# Patient Record
Sex: Male | Born: 1945 | Race: White | Hispanic: No | Marital: Married | Smoking: Current every day smoker
Health system: Southern US, Community
[De-identification: ages and names within clinical notes are randomized; demographics above are authoritative.]

## PROBLEM LIST (undated history)

## (undated) DIAGNOSIS — I10 Essential (primary) hypertension: Secondary | ICD-10-CM

## (undated) DIAGNOSIS — N200 Calculus of kidney: Secondary | ICD-10-CM

---

## 1998-06-28 ENCOUNTER — Emergency Department (HOSPITAL_COMMUNITY): Admission: EM | Admit: 1998-06-28 | Discharge: 1998-06-28 | Payer: Self-pay | Admitting: Emergency Medicine

## 2005-04-12 ENCOUNTER — Emergency Department (HOSPITAL_COMMUNITY): Admission: EM | Admit: 2005-04-12 | Discharge: 2005-04-12 | Payer: Self-pay | Admitting: Emergency Medicine

## 2005-04-24 ENCOUNTER — Emergency Department (HOSPITAL_COMMUNITY): Admission: EM | Admit: 2005-04-24 | Discharge: 2005-04-24 | Payer: Self-pay | Admitting: Emergency Medicine

## 2005-05-24 ENCOUNTER — Emergency Department (HOSPITAL_COMMUNITY): Admission: EM | Admit: 2005-05-24 | Discharge: 2005-05-24 | Payer: Self-pay | Admitting: Emergency Medicine

## 2015-04-15 ENCOUNTER — Encounter (HOSPITAL_COMMUNITY): Payer: Self-pay | Admitting: Physical Medicine and Rehabilitation

## 2015-04-15 ENCOUNTER — Emergency Department (HOSPITAL_COMMUNITY)
Admission: EM | Admit: 2015-04-15 | Discharge: 2015-04-15 | Disposition: A | Discharge status: Home / Self Care | Payer: Medicare Other | Attending: Emergency Medicine | Admitting: Emergency Medicine

## 2015-04-15 DIAGNOSIS — I10 Essential (primary) hypertension: Secondary | ICD-10-CM | POA: Insufficient documentation

## 2015-04-15 DIAGNOSIS — Z792 Long term (current) use of antibiotics: Secondary | ICD-10-CM | POA: Insufficient documentation

## 2015-04-15 DIAGNOSIS — L02212 Cutaneous abscess of back [any part, except buttock]: Secondary | ICD-10-CM | POA: Insufficient documentation

## 2015-04-15 DIAGNOSIS — Z79899 Other long term (current) drug therapy: Secondary | ICD-10-CM | POA: Insufficient documentation

## 2015-04-15 HISTORY — DX: Essential (primary) hypertension: I10

## 2015-04-15 MED ORDER — SULFAMETHOXAZOLE-TRIMETHOPRIM 800-160 MG PO TABS: 1.0000 | ORAL_TABLET | Freq: Two times a day (BID) | ORAL | Status: AC

## 2015-04-15 MED ORDER — LIDOCAINE-EPINEPHRINE 1 %-1:100000 IJ SOLN
10.0000 mL | Freq: Once | INTRAMUSCULAR | Status: AC
  Administered 2015-04-15: 1 mL
  Filled 2015-04-15: qty 1

## 2015-04-15 MED ORDER — HYDROCODONE-ACETAMINOPHEN 5-325 MG PO TABS: 1.0000 | ORAL_TABLET | ORAL | Status: DC | PRN

## 2015-04-15 NOTE — Discharge Instructions (Signed)
Abscess  An abscess is an infected area that contains a collection of pus and debris.It can occur in almost any part of the body. An abscess is also known as a furuncle or boil.  CAUSES   An abscess occurs when tissue gets infected. This can occur from blockage of oil or sweat glands, infection of hair follicles, or a minor injury to the skin. As the body tries to fight the infection, pus collects in the area and creates pressure under the skin. This pressure causes pain. People with weakened immune systems have difficulty fighting infections and get certain abscesses more often.   SYMPTOMS  Usually an abscess develops on the skin and becomes a painful mass that is red, warm, and tender. If the abscess forms under the skin, you may feel a moveable soft area under the skin. Some abscesses break open (rupture) on their own, but most will continue to get worse without care. The infection can spread deeper into the body and eventually into the bloodstream, causing you to feel ill.   DIAGNOSIS   Your caregiver will take your medical history and perform a physical exam. A sample of fluid may also be taken from the abscess to determine what is causing your infection.  TREATMENT   Your caregiver may prescribe antibiotic medicines to fight the infection. However, taking antibiotics alone usually does not cure an abscess. Your caregiver may need to make a small cut (incision) in the abscess to drain the pus. In some cases, gauze is packed into the abscess to reduce pain and to continue draining the area.  HOME CARE INSTRUCTIONS    Only take over-the-counter or prescription medicines for pain, discomfort, or fever as directed by your caregiver.   If you were prescribed antibiotics, take them as directed. Finish them even if you start to feel better.   If gauze is used, follow your caregiver's directions for changing the gauze.   To avoid spreading the infection:   Keep your draining abscess covered with a  bandage.   Wash your hands well.   Do not share personal care items, towels, or whirlpools with others.   Avoid skin contact with others.   Keep your skin and clothes clean around the abscess.   Keep all follow-up appointments as directed by your caregiver.  SEEK MEDICAL CARE IF:    You have increased pain, swelling, redness, fluid drainage, or bleeding.   You have muscle aches, chills, or a general ill feeling.   You have a fever.  MAKE SURE YOU:    Understand these instructions.   Will watch your condition.   Will get help right away if you are not doing well or get worse.  Document Released: 08/18/2005 Document Revised: 05/09/2012 Document Reviewed: 01/21/2012  ExitCare Patient Information 2015 [employer]. This information is not intended to replace advice given to you by your health care provider. Make sure you discuss any questions you have with your health care provider.  Incision and Drainage  Incision and drainage is a procedure in which a sac-like structure (cystic structure) is opened and drained. The area to be drained usually contains material such as pus, fluid, or blood.   LET YOUR CAREGIVER KNOW ABOUT:    Allergies to medicine.   Medicines taken, including vitamins, herbs, eyedrops, over-the-counter medicines, and creams.   Use of steroids (by mouth or creams).   Previous problems with anesthetics or numbing medicines.   History of bleeding problems or blood clots.     Previous surgery.   Other health problems, including diabetes and kidney problems.   Possibility of pregnancy, if this applies.  RISKS AND COMPLICATIONS   Pain.   Bleeding.   Scarring.   Infection.  BEFORE THE PROCEDURE   You may need to have an ultrasound or other imaging tests to see how large or deep your cystic structure is. Blood tests may also be used to determine if you have an infection or how severe the infection is. You may need to have a tetanus shot.  PROCEDURE   The affected area is cleaned with a  cleaning fluid. The cyst area will then be numbed with a medicine (local anesthetic). A small incision will be made in the cystic structure. A syringe or catheter may be used to drain the contents of the cystic structure, or the contents may be squeezed out. The area will then be flushed with a cleansing solution. After cleansing the area, it is often gently packed with a gauze or another wound dressing. Once it is packed, it will be covered with gauze and tape or some other type of wound dressing.  AFTER THE PROCEDURE    Often, you will be allowed to go home right after the procedure.   You may be given antibiotic medicine to prevent or heal an infection.   If the area was packed with gauze or some other wound dressing, you will likely need to come back in 1 to 2 days to get it removed.   The area should heal in about 14 days.  Document Released: 05/04/2001 Document Revised: 05/09/2012 Document Reviewed: 01/03/2012  ExitCare Patient Information 2015 [employer]. This information is not intended to replace advice given to you by your health care provider. Make sure you discuss any questions you have with your health care provider.

## 2015-04-15 NOTE — ED Provider Notes (Signed)
CSN: [id number]     Arrival date & time 04/15/15  1720 History  This chart was scribed for Marlon Peliffany Anila Bojarski, PA, working with Mancel BaleElliott Wentz, MD by Lyndel SafeKaitlyn Shelton and Gwenyth Oberatherine Macek, ED Scribes. This paitent was seen in room TR07C/TR07C and the patient's care was started at 7:07 PM.   Chief Complaint  Patient presents with  . Abscess    Patient is a 69 y.o. male presenting with abscess. The history is provided by the patient. No language interpreter was used.  Abscess Associated symptoms: no fever   HPI Comments: Douglas Chavez is a 69 y.o. male who presents to the Emergency Department complaining of a gradually worsening abscess, with associated 5/10 pain, in his mid-right upper back region that has been ongoing for several weeks.  He states purulent, yellow drainage from the area as an associated symptom. Pt reports the area started as a cyst that he has had for years, but became worse after he hit it on something. He notes a similar, nonpurulent cyst adjacent to the wound. Pt reports that he visited Urgent Care 5 days ago where they prescribed him Keflex, but did not drain the abscess. He denies any change in swelling after taking the Keflex. Pt denies a PMhx of DM. He also denies fever as an associated symptom.    NO nausea, vomiting, diarrhea, polyuria, dysuria, abdominal pain, CP, headache, SOB, weakness, confusion, fevers, chills   Past Medical History  Diagnosis Date  . Hypertension    History reviewed. No pertinent past surgical history. No family history on file. History  Substance Use Topics  . Smoking status: Never Smoker   . Smokeless tobacco: Not on file  . Alcohol Use: No    Review of Systems  Constitutional: Negative for fever.  Skin: Positive for wound.  All other systems reviewed and are negative.  Allergies  Review of patient's allergies indicates no known allergies.  Home Medications   Prior to Admission medications   Medication Sig Start Date End Date  Taking? Authorizing Provider  cephALEXin (KEFLEX) 500 MG capsule Take 500 mg by mouth 2 (two) times daily.   Yes Historical Provider, MD  ibuprofen (ADVIL,MOTRIN) 200 MG tablet Take 400 mg by mouth every 6 (six) hours as needed for mild pain.   Yes Historical Provider, MD  lisinopril (PRINIVIL,ZESTRIL) 20 MG tablet Take 20 mg by mouth daily.   Yes Historical Provider, MD  HYDROcodone-acetaminophen (NORCO/VICODIN) 5-325 MG per tablet Take 1-2 tablets by mouth every 4 (four) hours as needed. 04/15/15   Latrisha Coiro Neva SeatGreene, PA-C  sulfamethoxazole-trimethoprim (BACTRIM DS,SEPTRA DS) 800-160 MG per tablet Take 1 tablet by mouth 2 (two) times daily. 04/15/15 04/22/15  Garold Sheeler Neva SeatGreene, PA-C   BP 143/94 mmHg  Pulse 82  Temp(Src) 97.7 F (36.5 C) (Oral)  Resp 18  SpO2 97%  Physical Exam  Constitutional: He appears well-developed and well-nourished.  HENT:  Head: Normocephalic and atraumatic.  Eyes: Conjunctivae are normal. Right eye exhibits no discharge. Left eye exhibits no discharge.  Pulmonary/Chest: Effort normal. No respiratory distress.  Musculoskeletal:       Arms: Large abscess to upper back- approx 5 cm in diameter Area of small amount of discharge coming from cyst. Yellow/white purulent.   He has no tenderness over the spinal cord, tenderness to the touch  Neurological: He is alert. Coordination normal.  Skin: Skin is warm and dry. No rash noted. He is not diaphoretic. No erythema.  Psychiatric: He has a normal mood and affect.  Nursing  note and vitals reviewed.   ED Course  Procedures  DIAGNOSTIC STUDIES: Oxygen Saturation is 97% on RA, normal by my interpretation.    COORDINATION OF CARE: 7:10 PM Discussed treatment plan to preform incision and drainage procedure with patient. Pt agreed to course of treatment.   7:11 PM INCISION AND DRAINAGE PROCEDURE NOTE: Patient identification was confirmed and verbal consent was obtained. This procedure was performed by Marlon Pel, PA at  7:11 PM. Site: mid upper back  Sterile procedures observed  Needle size: 25 guage Anesthetic used (type and amt): lidocaine w/ epinephrine 1% Blade size: 11 Drainage: copious amounts of large cottage cheese- like exudate Complexity: Complex Packing used: yes, 10in Site anesthetized, incision made over site, wound drained and explored loculations, rinsed with copious amounts of normal saline, wound packed with sterile gauze, covered with dry, sterile dressing.  Pt tolerated procedure well without complications.  Instructions for care discussed verbally and pt provided with additional written instructions for homecare and f/u.   Labs Review Labs Reviewed - No data to display  Imaging Review No results found.   EKG Interpretation None      MDM   Final diagnoses:  Abscess of back    Dr. Effie Shy has seen patient as well. We removed infection from cyst but he needs to see a surgeon to have cyst removed.  Return in two days so that packing can be removed and wound re-evaluated. He is currently taken Keflex, Bactrim added on.  No systemic symptoms, pt has had significant pain relief after procedure.  69 y.o.Douglas Chavez's evaluation in the Emergency Department is complete. It has been determined that no acute conditions requiring further emergency intervention are present at this time. The patient/guardian have been advised of the diagnosis and plan. We have discussed signs and symptoms that warrant return to the ED, such as changes or worsening in symptoms.  Vital signs are stable at discharge. Filed Vitals:   04/15/15 1811  BP: 143/94  Pulse: 82  Temp: 97.7 F (36.5 C)  Resp: 18    Patient/guardian has voiced understanding and agreed to follow-up with the PCP or specialist.  I personally performed the services described in this documentation, which was scribed in my presence. The recorded information has been reviewed and is accurate.   Marlon Pel,  PA-C 04/15/15 2023  Mancel Bale, MD 04/16/15 [phone]

## 2015-04-15 NOTE — ED Notes (Signed)
Pt presents to department for evaluation of abscess to upper back. Ongoing for several weeks. Currently taking Keflex at home. 5/10 pain upon arrival to ED.

## 2015-04-15 NOTE — ED Provider Notes (Signed)
  Face-to-face evaluation   History: Here for evaluation of "abscess" of upper back. Going for several weeks. Similar symptoms with a pilonidal cyst  Physical exam: Alert, calm, cooperative, ambulates easily. No respiratory distress.  Medical screening examination/treatment/procedure(s) were conducted as a shared visit with non-physician practitioner(s) and myself.  I personally evaluated the patient during the encounter  Mancel BaleElliott Shaleena Crusoe, MD 04/16/15 [phone]

## 2015-04-17 ENCOUNTER — Encounter (HOSPITAL_COMMUNITY): Payer: Self-pay | Admitting: Emergency Medicine

## 2015-04-17 ENCOUNTER — Emergency Department (HOSPITAL_COMMUNITY)
Admission: EM | Admit: 2015-04-17 | Discharge: 2015-04-17 | Disposition: A | Discharge status: Home / Self Care | Payer: Medicare Other | Attending: Emergency Medicine | Admitting: Emergency Medicine

## 2015-04-17 DIAGNOSIS — Z87442 Personal history of urinary calculi: Secondary | ICD-10-CM | POA: Insufficient documentation

## 2015-04-17 DIAGNOSIS — I10 Essential (primary) hypertension: Secondary | ICD-10-CM | POA: Insufficient documentation

## 2015-04-17 DIAGNOSIS — Z5189 Encounter for other specified aftercare: Secondary | ICD-10-CM

## 2015-04-17 DIAGNOSIS — T814XXA Infection following a procedure, initial encounter: Secondary | ICD-10-CM | POA: Diagnosis not present

## 2015-04-17 DIAGNOSIS — K098 Other cysts of oral region, not elsewhere classified: Secondary | ICD-10-CM | POA: Insufficient documentation

## 2015-04-17 DIAGNOSIS — L02212 Cutaneous abscess of back [any part, except buttock]: Secondary | ICD-10-CM | POA: Insufficient documentation

## 2015-04-17 DIAGNOSIS — L720 Epidermal cyst: Secondary | ICD-10-CM

## 2015-04-17 DIAGNOSIS — Z792 Long term (current) use of antibiotics: Secondary | ICD-10-CM | POA: Insufficient documentation

## 2015-04-17 DIAGNOSIS — Z79899 Other long term (current) drug therapy: Secondary | ICD-10-CM | POA: Insufficient documentation

## 2015-04-17 HISTORY — DX: Calculus of kidney: N20.0

## 2015-04-17 MED ORDER — LIDOCAINE HCL (PF) 1 % IJ SOLN
30.0000 mL | Freq: Once | INTRAMUSCULAR | Status: AC
  Administered 2015-04-17: 30 mL
  Filled 2015-04-17: qty 30

## 2015-04-17 NOTE — Discharge Instructions (Signed)
Return to the ER in 2 days for wound recheck. Continue using warm soaks, pain medicine previously prescribed. Continue taking antibody is previously prescribed as directed. Return to the ER sooner if you develop any severe pain, high fever greater than 100.31F, nausea, vomiting. Call general surgery to set up an appointment as outpatient for follow-up.   Abscess An abscess is an infected area that contains a collection of pus and debris.It can occur in almost any part of the body. An abscess is also known as a furuncle or boil. CAUSES  An abscess occurs when tissue gets infected. This can occur from blockage of oil or sweat glands, infection of hair follicles, or a minor injury to the skin. As the body tries to fight the infection, pus collects in the area and creates pressure under the skin. This pressure causes pain. People with weakened immune systems have difficulty fighting infections and get certain abscesses more often.  SYMPTOMS Usually an abscess develops on the skin and becomes a painful mass that is red, warm, and tender. If the abscess forms under the skin, you may feel a moveable soft area under the skin. Some abscesses break open (rupture) on their own, but most will continue to get worse without care. The infection can spread deeper into the body and eventually into the bloodstream, causing you to feel ill.  DIAGNOSIS  Your caregiver will take your medical history and perform a physical exam. A sample of fluid may also be taken from the abscess to determine what is causing your infection. TREATMENT  Your caregiver may prescribe antibiotic medicines to fight the infection. However, taking antibiotics alone usually does not cure an abscess. Your caregiver may need to make a small cut (incision) in the abscess to drain the pus. In some cases, gauze is packed into the abscess to reduce pain and to continue draining the area. HOME CARE INSTRUCTIONS   Only take over-the-counter or  prescription medicines for pain, discomfort, or fever as directed by your caregiver.  If you were prescribed antibiotics, take them as directed. Finish them even if you start to feel better.  If gauze is used, follow your caregiver's directions for changing the gauze.  To avoid spreading the infection:  Keep your draining abscess covered with a bandage.  Wash your hands well.  Do not share personal care items, towels, or whirlpools with others.  Avoid skin contact with others.  Keep your skin and clothes clean around the abscess.  Keep all follow-up appointments as directed by your caregiver. SEEK MEDICAL CARE IF:   You have increased pain, swelling, redness, fluid drainage, or bleeding.  You have muscle aches, chills, or a general ill feeling.  You have a fever. MAKE SURE YOU:   Understand these instructions.  Will watch your condition.  Will get help right away if you are not doing well or get worse. Document Released: 08/18/2005 Document Revised: 05/09/2012 Document Reviewed: 01/21/2012 [facility] Patient Information 2015 [place], [place]. This information is not intended to replace advice given to you by your health care provider. Make sure you discuss any questions you have with your health care provider.

## 2015-04-17 NOTE — ED Provider Notes (Signed)
CSN: [id number]     Arrival date & time 04/17/15  1328 History   This chart was scribed for non-physician practitioner, Jinny Sanders, PA-C working with Pricilla Loveless, MD, by Abel Presto, ED Scribe. This patient was seen in room TR05C/TR05C and the patient's care was started at 2:29 PM.    Chief Complaint  Patient presents with  . Wound Check    The history is provided by the patient. No language interpreter was used.   HPI Comments: Douglas Chavez is a 69 y.o. male who presents to the Emergency Department for wound check. Pt was seen 2 days ago in ED for abscess to mid-right upper back with onset several weeks ago. An I&D was done a that time and packed and pt was instructed to return for wound check today.  Pt has not f/u with surgery yet. Pt reports associated drainage. Pt has taken pain medication for relief. Pt notes he has been compliant with the Keflex and Bactrim. Pt denies nausea, vomiting, fever and chills.    Past Medical History  Diagnosis Date  . Hypertension   . Kidney stones    History reviewed. No pertinent past surgical history. No family history on file. History  Substance Use Topics  . Smoking status: Never Smoker   . Smokeless tobacco: Not on file  . Alcohol Use: No    Review of Systems  Constitutional: Negative for fever and chills.  Gastrointestinal: Negative for nausea and vomiting.  Skin: Positive for wound.      Allergies  Review of patient's allergies indicates no known allergies.  Home Medications   Prior to Admission medications   Medication Sig Start Date End Date Taking? Authorizing Provider  cephALEXin (KEFLEX) 500 MG capsule Take 500 mg by mouth 2 (two) times daily.    Historical Provider, MD  HYDROcodone-acetaminophen (NORCO/VICODIN) 5-325 MG per tablet Take 1-2 tablets by mouth every 4 (four) hours as needed. 04/15/15   Tiffany Neva Seat, PA-C  ibuprofen (ADVIL,MOTRIN) 200 MG tablet Take 400 mg by mouth every 6 (six) hours as needed for  mild pain.    Historical Provider, MD  lisinopril (PRINIVIL,ZESTRIL) 20 MG tablet Take 20 mg by mouth daily.    Historical Provider, MD  sulfamethoxazole-trimethoprim (BACTRIM DS,SEPTRA DS) 800-160 MG per tablet Take 1 tablet by mouth 2 (two) times daily. 04/15/15 04/22/15  Tiffany Neva Seat, PA-C   BP 146/101 mmHg  Pulse 83  Temp(Src) 97.3 F (36.3 C) (Oral)  Resp 16  SpO2 98% Physical Exam  Constitutional: He is oriented to person, place, and time. He appears well-developed and well-nourished.  HENT:  Head: Normocephalic.  Eyes: Conjunctivae are normal.  Neck: Normal range of motion. Neck supple.  Pulmonary/Chest: Effort normal.  Musculoskeletal: Normal range of motion.  Neurological: He is alert and oriented to person, place, and time.  Skin: Skin is warm and dry.  upper back: 5 cm cystic lesion with purulent discharge, associated erythema  Psychiatric: He has a normal mood and affect. His behavior is normal.  Nursing note and vitals reviewed.     ED Course  Procedures (including critical care time) DIAGNOSTIC STUDIES: Oxygen Saturation is 95% on room air, normal by my interpretation.    COORDINATION OF CARE: 2:35 PM Discussed treatment plan with patient at beside, the patient agrees with the plan and has no further questions at this time.   Labs Review Labs Reviewed - No data to display  Imaging Review No results found.   EKG Interpretation None  INCISION AND DRAINAGE PROCEDURE NOTE: Patient identification was confirmed and verbal consent was obtained. This procedure was performed by Jinny SandersJoseph Damonie Ellenwood, PA-C at 3:24 PM. Site: upper back Sterile procedures observed Needle size: 25 gauge Anesthetic used (type and amt): lidocaine 1%  Blade size: #11, elliptical incision Drainage: granulated, purulent, copious amount. Complexity: Complex Packing used: 0.5 inch  Site anesthetized, incision made over site, wound drained and explored loculations, rinsed with copious  amounts of normal saline, wound packed with sterile gauze, covered with dry, sterile dressing.  Pt tolerated procedure well without complications.  Instructions for care discussed verbally and pt provided with additional written instructions for homecare and f/u.   MDM   Final diagnoses:  Wound check, abscess  Epidermoid cyst   Patient here with wound recheck. Patient does not have any systemic signs or symptoms. Patient is afebrile, well-appearing, hemodynamically stable and in no acute distress. Packing was removed from previous incision and drainage. Patient noted a continued to have copious amounts of drainage which is purulent, and mostly granulated. Patient states he has been compliant with anti-biotic regimen which is appropriate. Incision was re-incised, and elliptical fashion to prevent premature closure. Wound was irrigated copiously, large amount of drainage excised. Wound was packed again. Patient strongly encouraged to return in 48 hours for wound recheck. Return precautions discussed, patient encouraged to continue current antibody regimen. Patient verbalizes understanding and agreement of this plan.   I personally performed the services described in this documentation, which was scribed in my presence. The recorded information has been reviewed and is accurate.  BP 146/101 mmHg  Pulse 83  Temp(Src) 97.3 F (36.3 C) (Oral)  Resp 16  SpO2 98%  Signed,  Ladona MowJoe Kimbella Heisler, PA-C 12:36 AM  Patient seen and discussed with Dr. Pricilla LovelessScott Goldston, M.D.     Ladona MowJoe Shylyn Younce, PA-C 04/18/15 0036  Pricilla LovelessScott Goldston, MD 04/19/15 [phone]

## 2015-04-17 NOTE — ED Notes (Signed)
Here for recheck of abscess on back-- was told to return in 2 days.

## 2015-04-17 NOTE — ED Notes (Signed)
Pt stable, ambulatory, states understanding of discharge instructions, denies any pain

## 2015-04-17 NOTE — ED Notes (Signed)
patinet did not take his blood pressure meds yet

## 2015-04-19 ENCOUNTER — Emergency Department (HOSPITAL_COMMUNITY)
Admission: EM | Admit: 2015-04-19 | Discharge: 2015-04-19 | Disposition: A | Discharge status: Home / Self Care | Payer: Medicare Other | Attending: Emergency Medicine | Admitting: Emergency Medicine

## 2015-04-19 ENCOUNTER — Encounter (HOSPITAL_COMMUNITY): Payer: Self-pay | Admitting: *Deleted

## 2015-04-19 DIAGNOSIS — L729 Follicular cyst of the skin and subcutaneous tissue, unspecified: Secondary | ICD-10-CM | POA: Insufficient documentation

## 2015-04-19 DIAGNOSIS — L0889 Other specified local infections of the skin and subcutaneous tissue: Secondary | ICD-10-CM | POA: Insufficient documentation

## 2015-04-19 DIAGNOSIS — I10 Essential (primary) hypertension: Secondary | ICD-10-CM | POA: Insufficient documentation

## 2015-04-19 DIAGNOSIS — Z79899 Other long term (current) drug therapy: Secondary | ICD-10-CM | POA: Insufficient documentation

## 2015-04-19 DIAGNOSIS — Z5189 Encounter for other specified aftercare: Secondary | ICD-10-CM

## 2015-04-19 DIAGNOSIS — Z792 Long term (current) use of antibiotics: Secondary | ICD-10-CM | POA: Insufficient documentation

## 2015-04-19 DIAGNOSIS — Z4801 Encounter for change or removal of surgical wound dressing: Secondary | ICD-10-CM | POA: Insufficient documentation

## 2015-04-19 DIAGNOSIS — T814XXA Infection following a procedure, initial encounter: Secondary | ICD-10-CM | POA: Diagnosis not present

## 2015-04-19 DIAGNOSIS — Z87442 Personal history of urinary calculi: Secondary | ICD-10-CM | POA: Insufficient documentation

## 2015-04-19 DIAGNOSIS — L089 Local infection of the skin and subcutaneous tissue, unspecified: Secondary | ICD-10-CM

## 2015-04-19 NOTE — ED Provider Notes (Signed)
CSN: [id number]     Arrival date & time 04/19/15  1528 History  This chart was scribed for non-physician practitioner, Junius FinnerErin O'Malley, PA-C working with Donnetta HutchingBrian Cook, MD, by Jarvis Morganaylor Ferguson, ED Scribe. This patient was seen in room TR06C/TR06C and the patient's care was started at 4:52 PM.     Chief Complaint  Patient presents with  . Wound Check    The history is provided by the patient. No language interpreter was used.    HPI Comments: Douglas Chavez is a 69 y.o. male who presents to the Emergency Department for a wound check. Pt was seen in the ED 4 days for an epidermoid cyst to mid-right upper back onset for several weeks. An I&D was done at that time and packed and he was instructed to return for a wound check. Pt presented to the ED 2 days ago as instructed to have the wound checked the first time. He reports he was told to follow up in 48 hours to have the wound checked for a second time. Pt states he has been complaint with Keflex and Bactrim medications. Pt states he has 2-3 days left of his bactrim. Pt has taken pain medication with relief. He denies any h/o DM. Pt has not followed up with dermatology or a general surgeon for this issue. He denies nausea, vomiting, fever, or chills.   Past Medical History  Diagnosis Date  . Hypertension   . Kidney stones    History reviewed. No pertinent past surgical history. No family history on file. History  Substance Use Topics  . Smoking status: Never Smoker   . Smokeless tobacco: Not on file  . Alcohol Use: No    Review of Systems  Constitutional: Negative for fever and chills.  Gastrointestinal: Negative for nausea and vomiting.  Skin: Positive for wound (packed, drained epidermoid cyst).      Allergies  Review of patient's allergies indicates no known allergies.  Home Medications   Prior to Admission medications   Medication Sig Start Date End Date Taking? Authorizing Provider  cephALEXin (KEFLEX) 500 MG capsule Take 500  mg by mouth 2 (two) times daily.    Historical Provider, MD  HYDROcodone-acetaminophen (NORCO/VICODIN) 5-325 MG per tablet Take 1-2 tablets by mouth every 4 (four) hours as needed. 04/15/15   Tiffany Neva SeatGreene, PA-C  ibuprofen (ADVIL,MOTRIN) 200 MG tablet Take 400 mg by mouth every 6 (six) hours as needed for mild pain.    Historical Provider, MD  lisinopril (PRINIVIL,ZESTRIL) 20 MG tablet Take 20 mg by mouth daily.    Historical Provider, MD  sulfamethoxazole-trimethoprim (BACTRIM DS,SEPTRA DS) 800-160 MG per tablet Take 1 tablet by mouth 2 (two) times daily. 04/15/15 04/22/15  Marlon Peliffany Greene, PA-C   Triage Vitals: BP 138/90 mmHg  Pulse 85  Temp(Src) 97.4 F (36.3 C) (Oral)  Resp 18  Ht 5\' 8"  (1.727 m)  Wt 198 lb 9.6 oz (90.084 kg)  BMI 30.20 kg/m2  SpO2 97%  Physical Exam  Constitutional: He is oriented to person, place, and time. He appears well-developed and well-nourished.  HENT:  Head: Normocephalic and atraumatic.  Eyes: EOM are normal.  Neck: Normal range of motion.  Cardiovascular: Normal rate.   Pulmonary/Chest: Effort normal.  Musculoskeletal: Normal range of motion.  Neurological: He is alert and oriented to person, place, and time.  Skin: Skin is warm and dry.  2 cm raised area of erythema with 1 cm surrounding erythema, centralized opening with packing in place, scant yellow discharge. Small  amount of thick white discharge consistent with cyst material. Mild tenderness to area  Psychiatric: He has a normal mood and affect. His behavior is normal.  Nursing note and vitals reviewed.   ED Course  Procedures   The wound is cleansed, debrided of foreign material as much as possible, and dressed. The patient is alerted to watch for any signs of infection (redness, pus, pain, increased swelling or fever) and call if such occurs. Home wound care instructions are provided. Tetanus vaccination status reviewed: UTD  DIAGNOSTIC STUDIES: Oxygen Saturation is 97% on RA, normal by my  interpretation.    COORDINATION OF CARE:  5:10 PM- Dr. Adriana Simas at bedside assessing pt   Labs Review Labs Reviewed - No data to display  Imaging Review No results found.   EKG Interpretation None      MDM   Final diagnoses:  Infected cyst of skin  Encounter for wound care   Pt presenting to ED for wound recheck of infected sebaceous cyst. Packing removed, wound cleaned. Discussed pt with Dr. Adriana Simas who also examined wound.  Small amount of thick white discharge c/w cyst content able to be expressed from wound.  Dr. Adriana Simas advised not to repack wound, will leave open and encourage pt to clean daily with soap and water including having the shower water run over top wound. Bandage instructions provided. Advised to f/u on Tuesday, 5/31 for wound recheck. Will not increased duration of antibiotics at this time. Pt verbalized understanding and agreement with tx plan.   I personally performed the services described in this documentation, which was scribed in my presence. The recorded information has been reviewed and is accurate.    Junius Finner, PA-C 04/19/15 1910  Donnetta Hutching, MD 04/19/15 [phone]

## 2015-04-19 NOTE — ED Notes (Signed)
Pt was told to come back today for a wound check of drained epidermoid cyst.

## 2015-04-19 NOTE — Discharge Instructions (Signed)
Be sure to complete all your antibiotics as prescribed.  Follow up on Tuesday morning for wound recheck.  Be sure to keep area clean with soap and water.  You may allow warm shower water to run over wound to help clean area, then dry wound gently, apply thin layer of antibiotic ointment (e.g. Neosporin) and a lite guaze bandage.

## 2015-04-22 ENCOUNTER — Emergency Department (HOSPITAL_COMMUNITY)
Admission: EM | Admit: 2015-04-22 | Discharge: 2015-04-22 | Disposition: A | Discharge status: Home / Self Care | Payer: Medicare Other | Attending: Emergency Medicine | Admitting: Emergency Medicine

## 2015-04-22 ENCOUNTER — Encounter (HOSPITAL_COMMUNITY): Payer: Self-pay | Admitting: Physical Medicine and Rehabilitation

## 2015-04-22 DIAGNOSIS — T814XXA Infection following a procedure, initial encounter: Secondary | ICD-10-CM | POA: Diagnosis not present

## 2015-04-22 DIAGNOSIS — Z792 Long term (current) use of antibiotics: Secondary | ICD-10-CM | POA: Insufficient documentation

## 2015-04-22 DIAGNOSIS — Z79899 Other long term (current) drug therapy: Secondary | ICD-10-CM | POA: Insufficient documentation

## 2015-04-22 DIAGNOSIS — Z87442 Personal history of urinary calculi: Secondary | ICD-10-CM | POA: Insufficient documentation

## 2015-04-22 DIAGNOSIS — Z4801 Encounter for change or removal of surgical wound dressing: Secondary | ICD-10-CM | POA: Diagnosis not present

## 2015-04-22 DIAGNOSIS — I10 Essential (primary) hypertension: Secondary | ICD-10-CM | POA: Insufficient documentation

## 2015-04-22 DIAGNOSIS — Z5189 Encounter for other specified aftercare: Secondary | ICD-10-CM

## 2015-04-22 NOTE — ED Notes (Signed)
Pt to department for evaluation of wound check. Abscess to upper back, yellow drainage noted. Pt reports 5/10 pain upon arrival.

## 2015-04-22 NOTE — ED Notes (Signed)
Pt is in stable condition upon d/c and ambulates from ED. 

## 2015-04-22 NOTE — ED Notes (Addendum)
Here for recheck of large abscess on upper back. Continues to have purulent drainage from wound. Treatment started 5/24.

## 2015-04-22 NOTE — ED Provider Notes (Signed)
CSN: [id number]     Arrival date & time 04/22/15  7829 History  This chart was scribed for non-physician practitioner, Emilia Beck, PA-C, working with Rolland Porter, MD, by Lionel December, ED Scribe. This patient was seen in room TR06C/TR06C and the patient's care was started at 9:16 AM.   First MD Initiated Contact with Patient 04/22/15 [phone]     Chief Complaint  Patient presents with  . Wound Check     (Consider location/radiation/quality/duration/timing/severity/associated sxs/prior Treatment) Patient is a 69 y.o. male presenting with wound check. The history is provided by the patient. No language interpreter was used.  Wound Check    HPI Comments: Douglas Chavez is a 69 y.o. male who presents to the Emergency Department for a wound check on his back which he states is getting better. Patient was here two days ago and has been rinsing it out as much as he can since his last visit.  He purchased saline spray solution but lost it so he has been using distilled water to wash it out with.  He is almost out of Cephalexin and Bactrim which he was prescribed for and has been compliant with.  He has no other concerns today.     Past Medical History  Diagnosis Date  . Hypertension   . Kidney stones    History reviewed. No pertinent past surgical history. History reviewed. No pertinent family history. History  Substance Use Topics  . Smoking status: Never Smoker   . Smokeless tobacco: Not on file  . Alcohol Use: No    Review of Systems  Skin: Positive for wound.  All other systems reviewed and are negative.     Allergies  Review of patient's allergies indicates no known allergies.  Home Medications   Prior to Admission medications   Medication Sig Start Date End Date Taking? Authorizing Provider  cephALEXin (KEFLEX) 500 MG capsule Take 500 mg by mouth 2 (two) times daily.    Historical Provider, MD  HYDROcodone-acetaminophen (NORCO/VICODIN) 5-325 MG per tablet Take 1-2  tablets by mouth every 4 (four) hours as needed. 04/15/15   Tiffany Neva Seat, PA-C  ibuprofen (ADVIL,MOTRIN) 200 MG tablet Take 400 mg by mouth every 6 (six) hours as needed for mild pain.    Historical Provider, MD  lisinopril (PRINIVIL,ZESTRIL) 20 MG tablet Take 20 mg by mouth daily.    Historical Provider, MD  sulfamethoxazole-trimethoprim (BACTRIM DS,SEPTRA DS) 800-160 MG per tablet Take 1 tablet by mouth 2 (two) times daily. 04/15/15 04/22/15  Tiffany Neva Seat, PA-C   BP 145/87 mmHg  Pulse 86  Temp(Src) 98.7 F (37.1 C) (Oral)  Resp 18  SpO2 98% Physical Exam  Constitutional: He appears well-developed and well-nourished. No distress.  HENT:  Head: Normocephalic and atraumatic.  Eyes: Conjunctivae and EOM are normal.  Neck: Normal range of motion.  Cardiovascular: Normal rate.   Pulmonary/Chest: Effort normal. No respiratory distress.  Abdominal: Soft. He exhibits no distension. There is no tenderness.  Musculoskeletal: Normal range of motion.  Neurological: He is alert.  Skin: Skin is warm and dry.  Healing incision and drainage wound on central upper back, Minimal surrounding erythema and edema.   Psychiatric: He has a normal mood and affect. His behavior is normal.  Nursing note and vitals reviewed.   ED Course  Procedures (including critical care time) DIAGNOSTIC STUDIES: Oxygen Saturation is 98% on RA, normal by my interpretation.    COORDINATION OF CARE: 9:20 AM Discussed treatment plan with patient at beside, the patient  agrees with the plan and has no further questions at this time.   Labs Review Labs Reviewed - No data to display  Imaging Review No results found.   EKG Interpretation None      MDM   Final diagnoses:  Visit for wound check   Patient's wound appears to be healing well. Patient instructed to continue antibiotics and return with worsening or concerning symptoms.   I personally performed the services described in this documentation, which was  scribed in my presence. The recorded information has been reviewed and is accurate.    Emilia BeckKaitlyn Areebah Meinders, PA-C 04/22/15 1038  Rolland PorterMark James, MD 05/06/15 [phone]

## 2016-03-22 DIAGNOSIS — L723 Sebaceous cyst: Secondary | ICD-10-CM | POA: Diagnosis not present

## 2016-03-22 DIAGNOSIS — R42 Dizziness and giddiness: Secondary | ICD-10-CM | POA: Diagnosis not present

## 2016-03-22 DIAGNOSIS — I10 Essential (primary) hypertension: Secondary | ICD-10-CM | POA: Diagnosis not present

## 2016-08-04 DIAGNOSIS — Z125 Encounter for screening for malignant neoplasm of prostate: Secondary | ICD-10-CM | POA: Diagnosis not present

## 2016-08-04 DIAGNOSIS — E7800 Pure hypercholesterolemia, unspecified: Secondary | ICD-10-CM | POA: Diagnosis not present

## 2016-08-04 DIAGNOSIS — I10 Essential (primary) hypertension: Secondary | ICD-10-CM | POA: Diagnosis not present

## 2016-08-04 DIAGNOSIS — R5383 Other fatigue: Secondary | ICD-10-CM | POA: Diagnosis not present

## 2016-08-04 DIAGNOSIS — Z0000 Encounter for general adult medical examination without abnormal findings: Secondary | ICD-10-CM | POA: Diagnosis not present

## 2016-08-04 DIAGNOSIS — R0602 Shortness of breath: Secondary | ICD-10-CM | POA: Diagnosis not present

## 2016-08-04 DIAGNOSIS — N390 Urinary tract infection, site not specified: Secondary | ICD-10-CM | POA: Diagnosis not present

## 2016-08-18 DIAGNOSIS — R972 Elevated prostate specific antigen [PSA]: Secondary | ICD-10-CM | POA: Diagnosis not present

## 2016-08-18 DIAGNOSIS — N390 Urinary tract infection, site not specified: Secondary | ICD-10-CM | POA: Diagnosis not present

## 2016-10-11 DIAGNOSIS — R3914 Feeling of incomplete bladder emptying: Secondary | ICD-10-CM | POA: Diagnosis not present

## 2016-10-11 DIAGNOSIS — R972 Elevated prostate specific antigen [PSA]: Secondary | ICD-10-CM | POA: Diagnosis not present

## 2016-11-10 DIAGNOSIS — R972 Elevated prostate specific antigen [PSA]: Secondary | ICD-10-CM | POA: Diagnosis not present

## 2016-11-10 DIAGNOSIS — R946 Abnormal results of thyroid function studies: Secondary | ICD-10-CM | POA: Diagnosis not present

## 2016-11-10 DIAGNOSIS — Z125 Encounter for screening for malignant neoplasm of prostate: Secondary | ICD-10-CM | POA: Diagnosis not present

## 2016-12-24 DIAGNOSIS — R3914 Feeling of incomplete bladder emptying: Secondary | ICD-10-CM | POA: Diagnosis not present

## 2016-12-24 DIAGNOSIS — R972 Elevated prostate specific antigen [PSA]: Secondary | ICD-10-CM | POA: Diagnosis not present

## 2016-12-24 DIAGNOSIS — R8271 Bacteriuria: Secondary | ICD-10-CM | POA: Diagnosis not present

## 2017-05-11 ENCOUNTER — Emergency Department (HOSPITAL_BASED_OUTPATIENT_CLINIC_OR_DEPARTMENT_OTHER): Payer: Medicare Other

## 2017-05-11 ENCOUNTER — Emergency Department (HOSPITAL_BASED_OUTPATIENT_CLINIC_OR_DEPARTMENT_OTHER)
Admission: EM | Admit: 2017-05-11 | Discharge: 2017-05-11 | Disposition: A | Discharge status: Home / Self Care | Payer: Medicare Other | Attending: Emergency Medicine | Admitting: Emergency Medicine

## 2017-05-11 ENCOUNTER — Encounter (HOSPITAL_BASED_OUTPATIENT_CLINIC_OR_DEPARTMENT_OTHER): Payer: Self-pay

## 2017-05-11 DIAGNOSIS — Y929 Unspecified place or not applicable: Secondary | ICD-10-CM | POA: Insufficient documentation

## 2017-05-11 DIAGNOSIS — I10 Essential (primary) hypertension: Secondary | ICD-10-CM | POA: Insufficient documentation

## 2017-05-11 DIAGNOSIS — R0781 Pleurodynia: Secondary | ICD-10-CM | POA: Diagnosis not present

## 2017-05-11 DIAGNOSIS — R0782 Intercostal pain: Secondary | ICD-10-CM | POA: Diagnosis not present

## 2017-05-11 DIAGNOSIS — X509XXA Other and unspecified overexertion or strenuous movements or postures, initial encounter: Secondary | ICD-10-CM | POA: Diagnosis not present

## 2017-05-11 DIAGNOSIS — T148XXA Other injury of unspecified body region, initial encounter: Secondary | ICD-10-CM

## 2017-05-11 DIAGNOSIS — F17210 Nicotine dependence, cigarettes, uncomplicated: Secondary | ICD-10-CM | POA: Diagnosis not present

## 2017-05-11 DIAGNOSIS — Y999 Unspecified external cause status: Secondary | ICD-10-CM | POA: Insufficient documentation

## 2017-05-11 DIAGNOSIS — S20212A Contusion of left front wall of thorax, initial encounter: Secondary | ICD-10-CM

## 2017-05-11 DIAGNOSIS — S29011A Strain of muscle and tendon of front wall of thorax, initial encounter: Secondary | ICD-10-CM | POA: Diagnosis not present

## 2017-05-11 DIAGNOSIS — Z79899 Other long term (current) drug therapy: Secondary | ICD-10-CM | POA: Insufficient documentation

## 2017-05-11 DIAGNOSIS — Y939 Activity, unspecified: Secondary | ICD-10-CM | POA: Insufficient documentation

## 2017-05-11 MED ORDER — CYCLOBENZAPRINE HCL 10 MG PO TABS: 10.0000 mg | ORAL_TABLET | Freq: Two times a day (BID) | ORAL | 0 refills | Status: DC | PRN

## 2017-05-11 NOTE — ED Triage Notes (Signed)
C/o left rib ara pain after lying against a pump house 5 days ago-no direct injury-c/o cont'd pain to left rib-NAD-steady gait

## 2017-05-11 NOTE — ED Provider Notes (Signed)
[facility] Provider Note   CSN: [id number] Arrival date & time: 05/11/17  1930  By signing my name below, I, Phillips Climes, attest that this documentation has been prepared under the direction and in the presence of Tinsleigh Slovacek, PA-C. Electronically Signed: Phillips Climes, Scribe. 05/11/2017. 8:54 PM.  History   Chief Complaint Chief Complaint  Patient presents with  . Rib Injury   HPI Comments Douglas Chavez is a 71 y.o. male with a PMHx significant for HTN, who presents to the Emergency Department with complaints of sudden onset left rib pain x1 day.  Pt was leaning against a water pump when he felt his left rib cage "collapse" x5 days ago.  Pt states that he has no pain until yesterday.  Pain worse with sitting and standing, pain notably worse when laying down.  It has increased since onset.  Pt additionally endorses abdominal pain.  No nausea or vomiting.  He denies experiencing any other acute sx.  Pt is left handed, and has been engaging in some strenuous activity with his left arm, including cranking the lawn mover and picking squash.  The history is provided by the patient. No language interpreter was used.    Past Medical History:  Diagnosis Date  . Hypertension   . Kidney stones    There are no active problems to display for this patient.  History reviewed. No pertinent surgical history.  Home Medications    Prior to Admission medications   Medication Sig Start Date End Date Taking? Authorizing Provider  cephALEXin (KEFLEX) 500 MG capsule Take 500 mg by mouth 2 (two) times daily.    [provider]  HYDROcodone-acetaminophen (NORCO/VICODIN) 5-325 MG per tablet Take 1-2 tablets by mouth every 4 (four) hours as needed. 04/15/15   Marlon Pel, PA-C  ibuprofen (ADVIL,MOTRIN) 200 MG tablet Take 400 mg by mouth every 6 (six) hours as needed for mild pain.    [provider]  lisinopril (PRINIVIL,ZESTRIL) 20 MG tablet Take 20 mg by  mouth daily.    [provider]    Family History No family history on file.  Social History Social History  Substance Use Topics  . Smoking status: Current Every Day Smoker    Types: Cigarettes  . Smokeless tobacco: Never Used  . Alcohol use No     Allergies   Patient has no known allergies.  Review of Systems Review of Systems  Constitutional: Negative for chills and fever.  Respiratory: Negative for chest tightness and shortness of breath.   Cardiovascular: Positive for chest pain.  Gastrointestinal: Positive for abdominal pain. Negative for nausea and vomiting.  Skin: Negative for wound.  All other systems reviewed and are negative.   Physical Exam Updated Vital Signs BP (!) 167/111 (BP Location: Left Arm)   Pulse 81   Temp 97.6 F (36.4 C) (Oral)   Resp 18   SpO2 97%   Physical Exam  Constitutional: He is oriented to person, place, and time. He appears well-developed and well-nourished. No distress.  HENT:  Head: Normocephalic and atraumatic.  Eyes: Conjunctivae are normal.  Neck: Normal range of motion.  Cardiovascular: Normal rate, regular rhythm and normal heart sounds.   Pulmonary/Chest: Effort normal and breath sounds normal. No respiratory distress. He has no wheezes. He exhibits tenderness.  No bruising noted over the left lower ribs, diffusely tender over anterior, lateral, posterior lower ribs. No crepitus. No flail chest.  Abdominal: Soft. Bowel sounds are normal. He exhibits no  distension. There is tenderness.  Mild tenderness in the left upper quadrant, no bruising  Musculoskeletal: Normal range of motion.  Neurological: He is alert and oriented to person, place, and time.  Skin: Skin is warm and dry.  Psychiatric: He has a normal mood and affect.  Nursing note and vitals reviewed.  ED Treatments / Results  DIAGNOSTIC STUDIES: Oxygen Saturation is 97% on room air, normal by my interpretation.    COORDINATION OF CARE: 8:37 PM  Discussed treatment plan with pt at bedside and pt agreed to plan.  Labs (all labs ordered are listed, but only abnormal results are displayed) Labs Reviewed - No data to display  EKG  EKG Interpretation None       Radiology Dg Ribs Unilateral W/chest Left  Result Date: 05/11/2017 CLINICAL DATA:  71 year old male with left low anterior rib pain since leading over a brick fence 4 days previously EXAM: LEFT RIBS AND CHEST - 3+ VIEW COMPARISON:  None. FINDINGS: No fracture or other bone lesions are seen involving the ribs. There is no evidence of pneumothorax or pleural effusion. Both lungs are clear. Heart size and mediastinal contours are within normal limits. IMPRESSION: Negative. Electronically Signed   By: Malachy MoanHeath  McCullough M.D.   On: 05/11/2017 20:14    Procedures Procedures (including critical care time)  Medications Ordered in ED Medications - No data to display   Initial Impression / Assessment and Plan / ED Course  I have reviewed the triage vital signs and the nursing notes.  Pertinent labs & imaging results that were available during my care of the patient were reviewed by me and considered in my medical decision making (see chart for details).     Patient emergency department with left lower rib pain after an injury. He has had some tenderness on abdominal exam. I discussed this with Dr. Dalene SeltzerSchlossman who has seen him as well. On her exam, patient did not have as much tenderness. His x-ray of the ribs is negative for any acute fractures. Injury occurred several days ago. His vital signs are stable at this time. Patient was discharged home by Dr. Dalene SeltzerSchlossman, with low concern for intra-abdominal trauma. Most likely rib contusion versus a muscle strain. Will be treated with Flexeril. Follow-up as needed. Return precautions discussed.  Vitals:   05/11/17 1944 05/11/17 2200  BP: (!) 167/111 (!) 144/95  Pulse: 81 85  Resp: 18 18  Temp: 97.6 F (36.4 C)   TempSrc: Oral     SpO2: 97% 98%     Final Clinical Impressions(s) / ED Diagnoses   Final diagnoses:  Rib contusion, left, initial encounter  Muscle strain   I personally performed the services described in this documentation, which was scribed in my presence. The recorded information has been reviewed and is accurate.   New Prescriptions New Prescriptions   No medications on file     Jaynie CrumbleKirichenko, Colter Magowan, Cordelia Poche-C 05/12/17 0128    Alvira MondaySchlossman, Erin, MD 05/12/17 1318

## 2018-11-03 IMAGING — CR DG RIBS W/ CHEST 3+V*L*
3 series · 3 of 3 positions shown · non-contrast
Comparison: None.

CLINICAL DATA: 70-year-old male with left low anterior rib pain
since leading over a brick fence 4 days previously

EXAM:
LEFT RIBS AND CHEST - 3+ VIEW

[w chest pa]
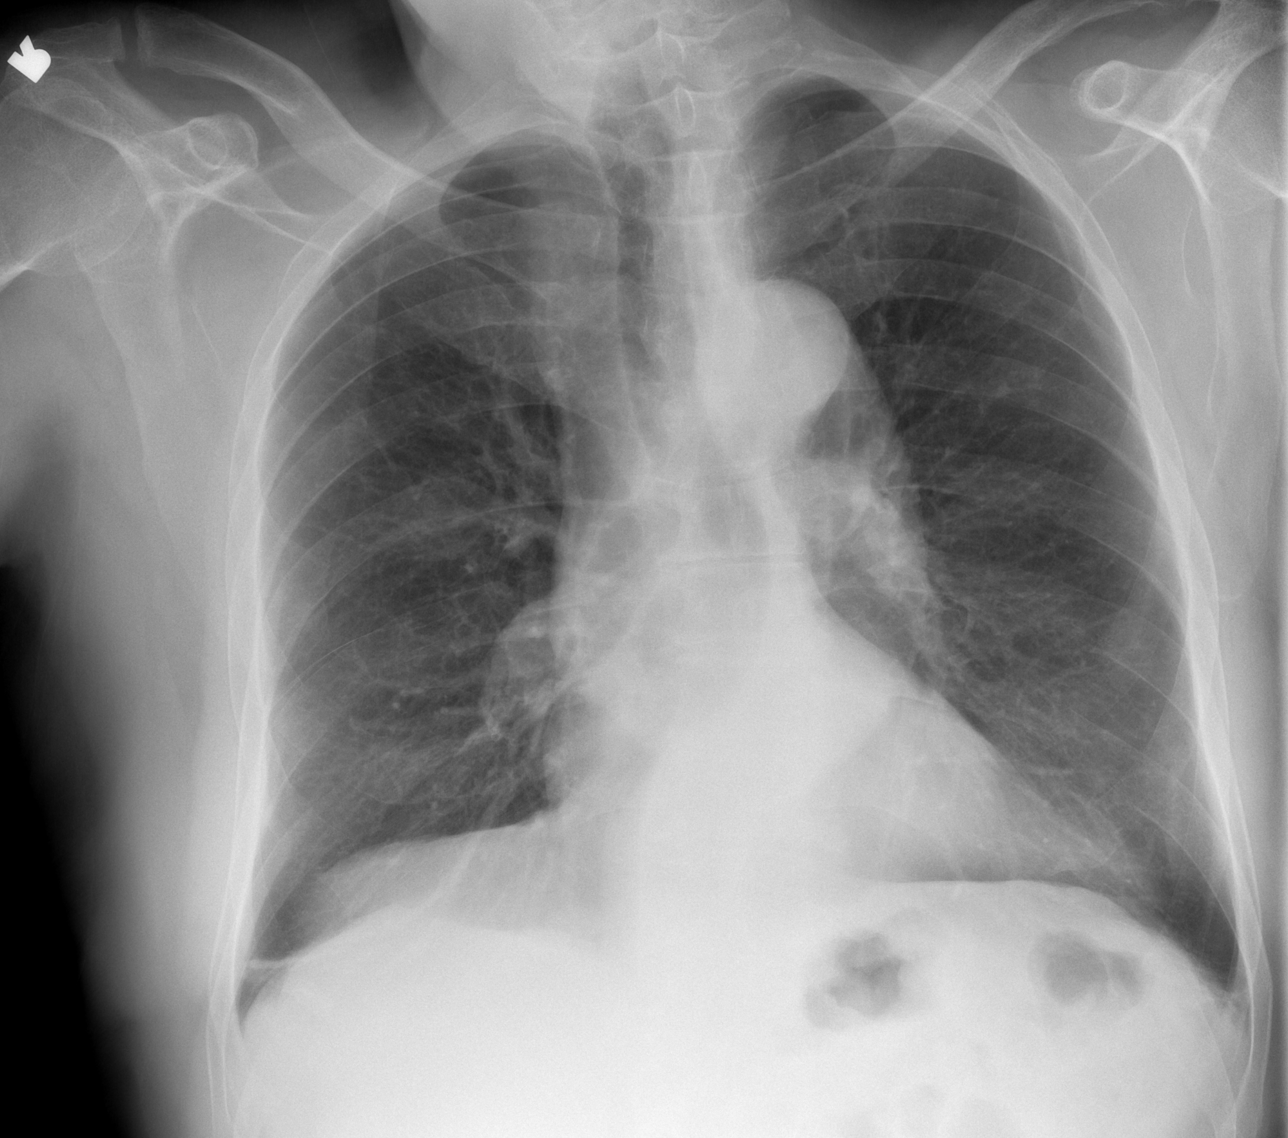

[w ribs ap/pa upper left]
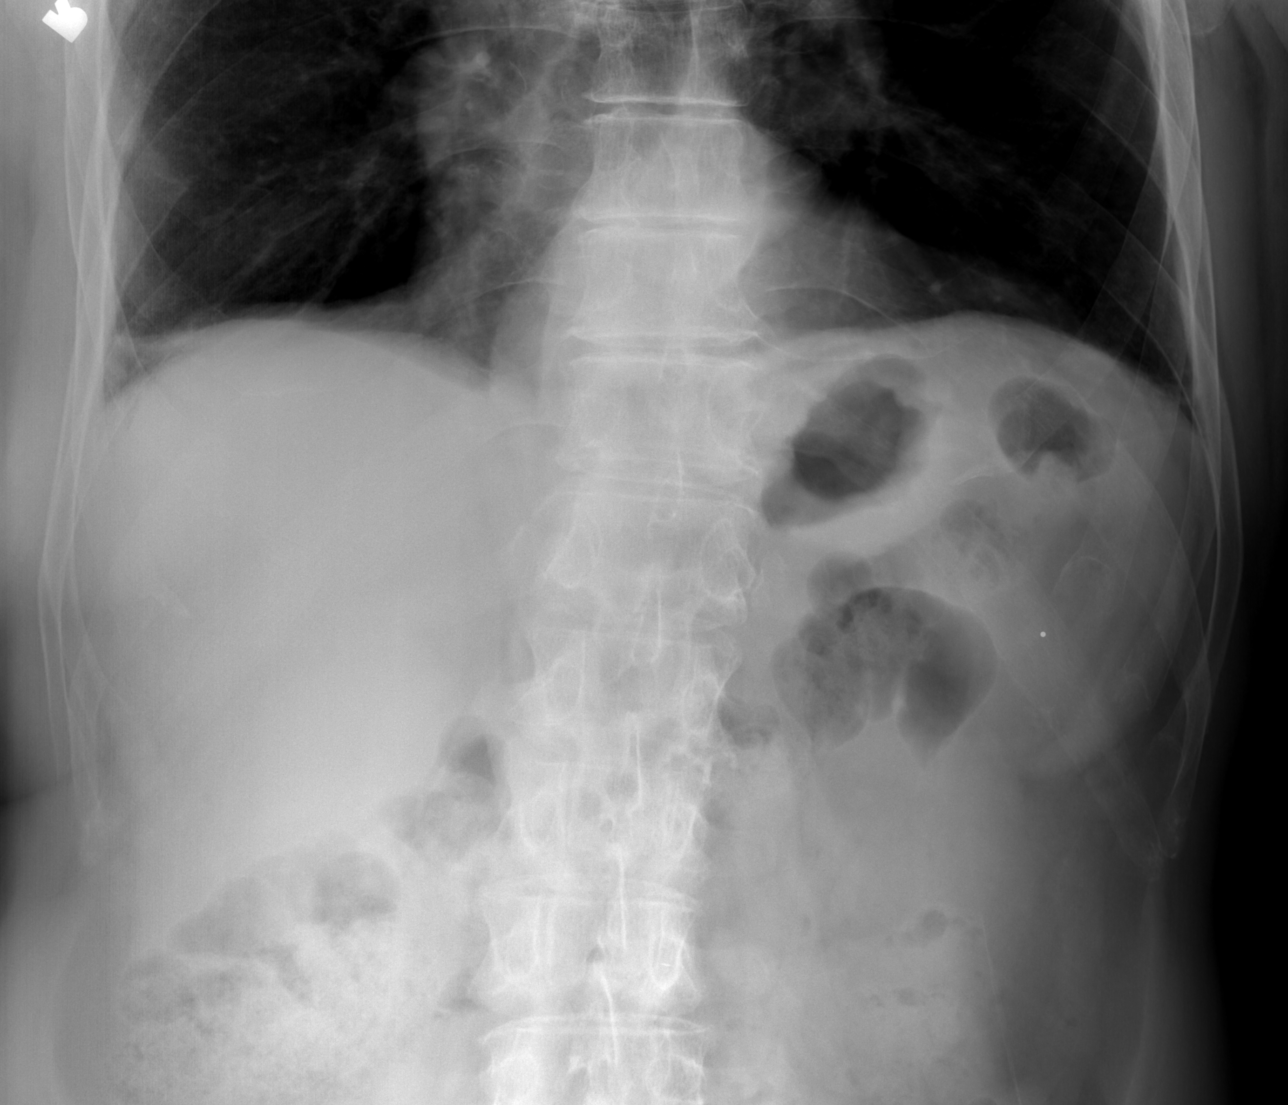

[w ribs oblique left]
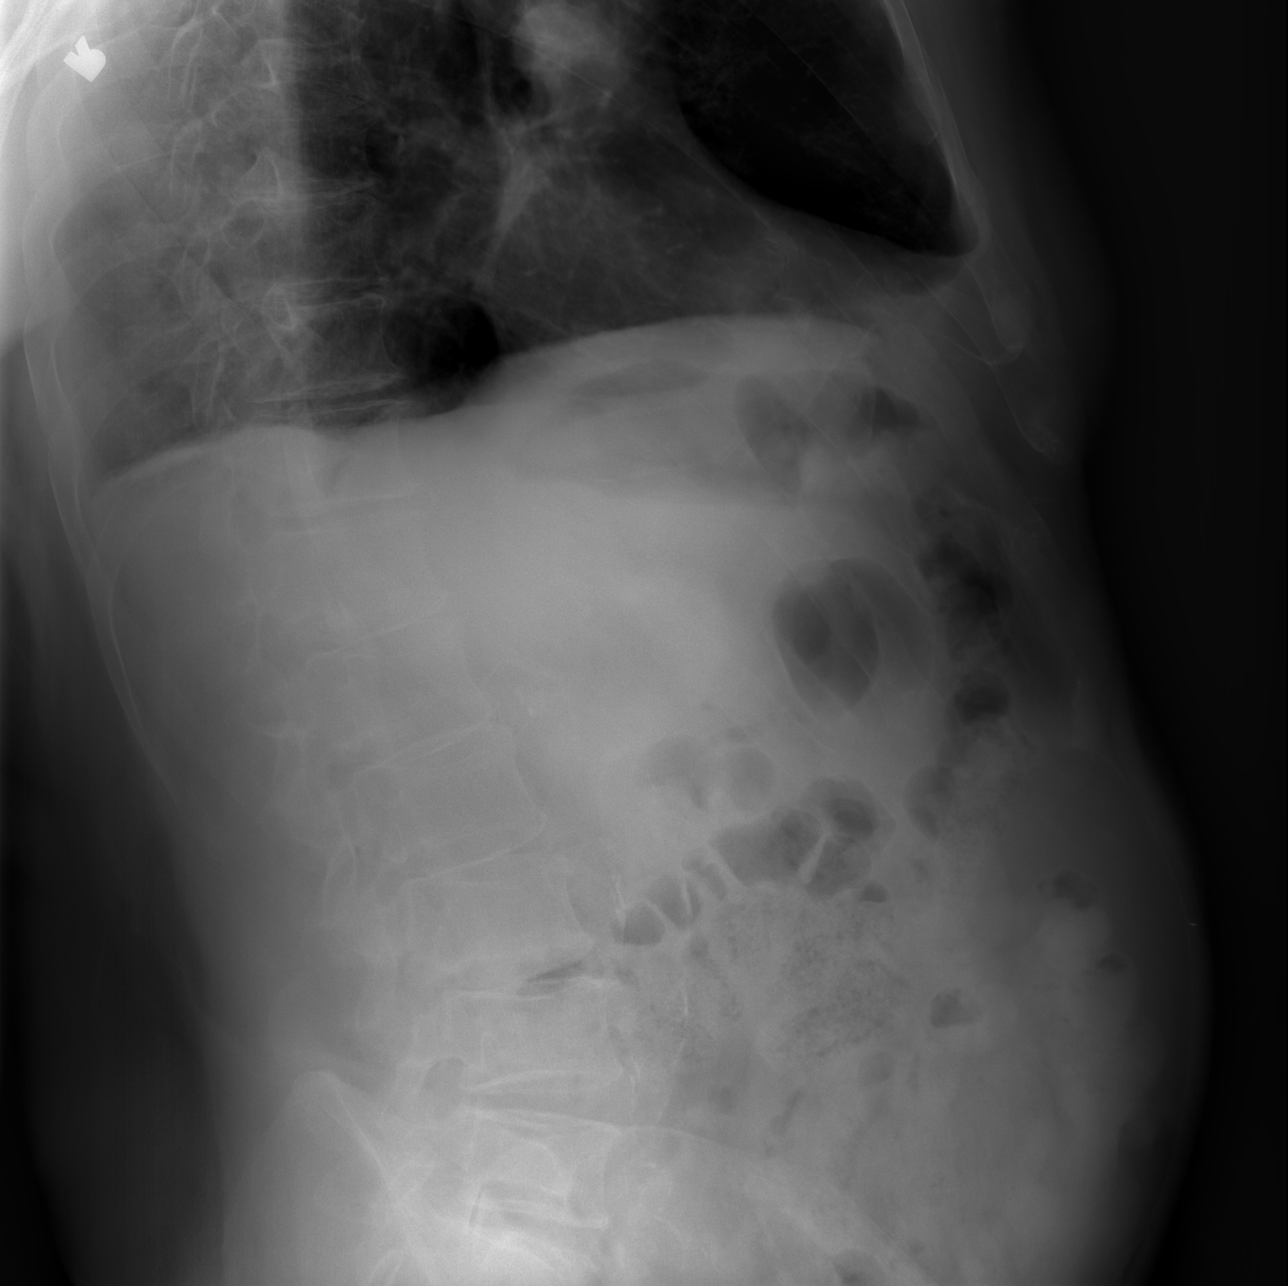

[3 of 3 positions shown; findings below may reference images not displayed]

FINDINGS: No fracture or other bone lesions are seen involving the ribs. There
is no evidence of pneumothorax or pleural effusion. Both lungs are
clear. Heart size and mediastinal contours are within normal limits.
IMPRESSION: Negative.

## 2023-01-31 DIAGNOSIS — T1490XA Injury, unspecified, initial encounter: Secondary | ICD-10-CM | POA: Diagnosis not present

## 2023-01-31 DIAGNOSIS — M4854XA Collapsed vertebra, not elsewhere classified, thoracic region, initial encounter for fracture: Secondary | ICD-10-CM | POA: Diagnosis not present

## 2023-01-31 DIAGNOSIS — N3000 Acute cystitis without hematuria: Secondary | ICD-10-CM | POA: Diagnosis not present

## 2023-01-31 DIAGNOSIS — I440 Atrioventricular block, first degree: Secondary | ICD-10-CM | POA: Diagnosis not present

## 2023-01-31 DIAGNOSIS — M47814 Spondylosis without myelopathy or radiculopathy, thoracic region: Secondary | ICD-10-CM | POA: Diagnosis not present

## 2023-01-31 DIAGNOSIS — R296 Repeated falls: Secondary | ICD-10-CM | POA: Diagnosis not present

## 2023-01-31 DIAGNOSIS — R531 Weakness: Secondary | ICD-10-CM | POA: Diagnosis not present

## 2023-01-31 DIAGNOSIS — R5383 Other fatigue: Secondary | ICD-10-CM | POA: Diagnosis not present

## 2023-01-31 DIAGNOSIS — R9389 Abnormal findings on diagnostic imaging of other specified body structures: Secondary | ICD-10-CM | POA: Diagnosis not present

## 2023-01-31 DIAGNOSIS — M2578 Osteophyte, vertebrae: Secondary | ICD-10-CM | POA: Diagnosis not present

## 2023-01-31 DIAGNOSIS — N3090 Cystitis, unspecified without hematuria: Secondary | ICD-10-CM | POA: Diagnosis not present

## 2023-10-19 ENCOUNTER — Emergency Department (HOSPITAL_BASED_OUTPATIENT_CLINIC_OR_DEPARTMENT_OTHER): Payer: No Typology Code available for payment source

## 2023-10-19 ENCOUNTER — Emergency Department (HOSPITAL_BASED_OUTPATIENT_CLINIC_OR_DEPARTMENT_OTHER): Payer: No Typology Code available for payment source | Admitting: Radiology

## 2023-10-19 ENCOUNTER — Encounter (HOSPITAL_BASED_OUTPATIENT_CLINIC_OR_DEPARTMENT_OTHER): Payer: Self-pay

## 2023-10-19 ENCOUNTER — Inpatient Hospital Stay (HOSPITAL_BASED_OUTPATIENT_CLINIC_OR_DEPARTMENT_OTHER)
Admission: EM | Admit: 2023-10-19 | Discharge: 2023-10-22 | DRG: 871 | Disposition: A | Discharge status: Home / Self Care | Payer: No Typology Code available for payment source | Attending: Internal Medicine | Admitting: Internal Medicine

## 2023-10-19 ENCOUNTER — Other Ambulatory Visit: Payer: Self-pay

## 2023-10-19 DIAGNOSIS — G9341 Metabolic encephalopathy: Secondary | ICD-10-CM | POA: Diagnosis present

## 2023-10-19 DIAGNOSIS — Z87442 Personal history of urinary calculi: Secondary | ICD-10-CM | POA: Diagnosis not present

## 2023-10-19 DIAGNOSIS — M6282 Rhabdomyolysis: Secondary | ICD-10-CM | POA: Diagnosis not present

## 2023-10-19 DIAGNOSIS — B9689 Other specified bacterial agents as the cause of diseases classified elsewhere: Secondary | ICD-10-CM | POA: Diagnosis present

## 2023-10-19 DIAGNOSIS — A419 Sepsis, unspecified organism: Principal | ICD-10-CM | POA: Diagnosis present

## 2023-10-19 DIAGNOSIS — R748 Abnormal levels of other serum enzymes: Secondary | ICD-10-CM

## 2023-10-19 DIAGNOSIS — E860 Dehydration: Secondary | ICD-10-CM | POA: Diagnosis present

## 2023-10-19 DIAGNOSIS — Z720 Tobacco use: Secondary | ICD-10-CM | POA: Diagnosis present

## 2023-10-19 DIAGNOSIS — R6520 Severe sepsis without septic shock: Secondary | ICD-10-CM | POA: Diagnosis not present

## 2023-10-19 DIAGNOSIS — R000 Tachycardia, unspecified: Secondary | ICD-10-CM | POA: Diagnosis not present

## 2023-10-19 DIAGNOSIS — E871 Hypo-osmolality and hyponatremia: Secondary | ICD-10-CM | POA: Diagnosis present

## 2023-10-19 DIAGNOSIS — W19XXXA Unspecified fall, initial encounter: Secondary | ICD-10-CM | POA: Diagnosis not present

## 2023-10-19 DIAGNOSIS — F17210 Nicotine dependence, cigarettes, uncomplicated: Secondary | ICD-10-CM | POA: Diagnosis present

## 2023-10-19 DIAGNOSIS — Z1152 Encounter for screening for COVID-19: Secondary | ICD-10-CM | POA: Diagnosis not present

## 2023-10-19 DIAGNOSIS — K118 Other diseases of salivary glands: Secondary | ICD-10-CM

## 2023-10-19 DIAGNOSIS — A4189 Other specified sepsis: Principal | ICD-10-CM | POA: Diagnosis present

## 2023-10-19 DIAGNOSIS — E876 Hypokalemia: Secondary | ICD-10-CM | POA: Diagnosis present

## 2023-10-19 DIAGNOSIS — N400 Enlarged prostate without lower urinary tract symptoms: Secondary | ICD-10-CM | POA: Diagnosis not present

## 2023-10-19 DIAGNOSIS — N3001 Acute cystitis with hematuria: Secondary | ICD-10-CM

## 2023-10-19 DIAGNOSIS — G9340 Encephalopathy, unspecified: Secondary | ICD-10-CM | POA: Diagnosis present

## 2023-10-19 DIAGNOSIS — I10 Essential (primary) hypertension: Secondary | ICD-10-CM | POA: Diagnosis not present

## 2023-10-19 DIAGNOSIS — R4182 Altered mental status, unspecified: Secondary | ICD-10-CM

## 2023-10-19 DIAGNOSIS — D6959 Other secondary thrombocytopenia: Secondary | ICD-10-CM | POA: Diagnosis present

## 2023-10-19 DIAGNOSIS — I959 Hypotension, unspecified: Secondary | ICD-10-CM | POA: Diagnosis not present

## 2023-10-19 DIAGNOSIS — K760 Fatty (change of) liver, not elsewhere classified: Secondary | ICD-10-CM | POA: Diagnosis not present

## 2023-10-19 DIAGNOSIS — R0689 Other abnormalities of breathing: Secondary | ICD-10-CM | POA: Diagnosis not present

## 2023-10-19 DIAGNOSIS — Z1611 Resistance to penicillins: Secondary | ICD-10-CM | POA: Diagnosis not present

## 2023-10-19 DIAGNOSIS — Z79899 Other long term (current) drug therapy: Secondary | ICD-10-CM | POA: Diagnosis not present

## 2023-10-19 DIAGNOSIS — N390 Urinary tract infection, site not specified: Secondary | ICD-10-CM | POA: Diagnosis present

## 2023-10-19 LAB — URINALYSIS, ROUTINE W REFLEX MICROSCOPIC
Bilirubin Urine: NEGATIVE
Glucose, UA: NEGATIVE mg/dL
Ketones, ur: NEGATIVE mg/dL
Nitrite: NEGATIVE
Protein, ur: NEGATIVE mg/dL
Specific Gravity, Urine: 1.016 (ref 1.005–1.030)
pH: 7 (ref 5.0–8.0)

## 2023-10-19 LAB — COMPREHENSIVE METABOLIC PANEL
ALT: 15 U/L (ref 0–44)
AST: 52 U/L — ABNORMAL HIGH (ref 15–41)
Albumin: 4 g/dL (ref 3.5–5.0)
Alkaline Phosphatase: 62 U/L (ref 38–126)
Anion gap: 11 (ref 5–15)
BUN: 24 mg/dL — ABNORMAL HIGH (ref 8–23)
CO2: 21 mmol/L — ABNORMAL LOW (ref 22–32)
Calcium: 8.7 mg/dL — ABNORMAL LOW (ref 8.9–10.3)
Chloride: 105 mmol/L (ref 98–111)
Creatinine, Ser: 0.84 mg/dL (ref 0.61–1.24)
GFR, Estimated: >60 mL/min (ref >60)
Glucose, Bld: 103 mg/dL — ABNORMAL HIGH (ref 70–99)
Potassium: 3.4 mmol/L — ABNORMAL LOW (ref 3.5–5.1)
Sodium: 137 mmol/L (ref 135–145)
Total Bilirubin: 1.5 mg/dL — ABNORMAL HIGH (ref <1.2)
Total Protein: 6.3 g/dL — ABNORMAL LOW (ref 6.5–8.1)

## 2023-10-19 LAB — CBC WITH DIFFERENTIAL/PLATELET
Abs Immature Granulocytes: 0.07 10*3/uL (ref 0.00–0.07)
Basophils Absolute: 0 10*3/uL (ref 0.0–0.1)
Basophils Relative: 0 %
Eosinophils Absolute: 0 10*3/uL (ref 0.0–0.5)
Eosinophils Relative: 0 %
HCT: 41.8 % (ref 39.0–52.0)
Hemoglobin: 14.5 g/dL (ref 13.0–17.0)
Immature Granulocytes: 1 %
Lymphocytes Relative: 2 %
Lymphs Abs: 0.3 10*3/uL — ABNORMAL LOW (ref 0.7–4.0)
MCH: 31.9 pg (ref 26.0–34.0)
MCHC: 34.7 g/dL (ref 30.0–36.0)
MCV: 92.1 fL (ref 80.0–100.0)
Monocytes Absolute: 0.6 10*3/uL (ref 0.1–1.0)
Monocytes Relative: 5 %
Neutro Abs: 12.3 10*3/uL — ABNORMAL HIGH (ref 1.7–7.7)
Neutrophils Relative %: 92 %
Platelets: 114 10*3/uL — ABNORMAL LOW (ref 150–400)
RBC: 4.54 MIL/uL (ref 4.22–5.81)
RDW: 12.7 % (ref 11.5–15.5)
WBC: 13.3 10*3/uL — ABNORMAL HIGH (ref 4.0–10.5)
nRBC: 0 % (ref 0.0–0.2)

## 2023-10-19 LAB — MAGNESIUM: Magnesium: 1.8 mg/dL (ref 1.7–2.4)

## 2023-10-19 LAB — LACTIC ACID, PLASMA: Lactic Acid, Venous: 1.4 mmol/L (ref 0.5–1.9)

## 2023-10-19 LAB — APTT: aPTT: 27 s (ref 24–36)

## 2023-10-19 LAB — RESP PANEL BY RT-PCR (RSV, FLU A&B, COVID)  RVPGX2
Influenza A by PCR: NEGATIVE
Influenza B by PCR: NEGATIVE
Resp Syncytial Virus by PCR: NEGATIVE
SARS Coronavirus 2 by RT PCR: NEGATIVE

## 2023-10-19 LAB — CK: Total CK: 3413 U/L — ABNORMAL HIGH (ref 49–397)

## 2023-10-19 LAB — PROTIME-INR
INR: 1.1 (ref 0.8–1.2)
Prothrombin Time: 14 s (ref 11.4–15.2)

## 2023-10-19 LAB — CBG MONITORING, ED: Glucose-Capillary: 125 mg/dL — ABNORMAL HIGH (ref 70–99)

## 2023-10-19 MED ORDER — ACETAMINOPHEN 325 MG PO TABS: 650.0000 mg | ORAL_TABLET | Freq: Four times a day (QID) | ORAL | Status: DC | PRN

## 2023-10-19 MED ORDER — ACETAMINOPHEN 650 MG RE SUPP: 650.0000 mg | Freq: Four times a day (QID) | RECTAL | Status: DC | PRN

## 2023-10-19 MED ORDER — ONDANSETRON HCL 4 MG/2ML IJ SOLN: 4.0000 mg | Freq: Four times a day (QID) | INTRAMUSCULAR | Status: DC | PRN

## 2023-10-19 MED ORDER — VANCOMYCIN HCL IN DEXTROSE 1-5 GM/200ML-% IV SOLN
1000.0000 mg | Freq: Once | INTRAVENOUS | Status: AC
  Administered 2023-10-19: 1000 mg via INTRAVENOUS
  Filled 2023-10-19: qty 200

## 2023-10-19 MED ORDER — SODIUM CHLORIDE 0.9 % IV SOLN
2.0000 g | Freq: Once | INTRAVENOUS | Status: AC
  Administered 2023-10-19: 2 g via INTRAVENOUS
  Filled 2023-10-19: qty 12.5

## 2023-10-19 MED ORDER — ACETAMINOPHEN 500 MG PO TABS
1000.0000 mg | ORAL_TABLET | Freq: Once | ORAL | Status: AC
  Administered 2023-10-19: 1000 mg via ORAL
  Filled 2023-10-19: qty 2

## 2023-10-19 MED ORDER — IOHEXOL 300 MG/ML  SOLN
100.0000 mL | Freq: Once | INTRAMUSCULAR | Status: AC | PRN
  Administered 2023-10-19: 85 mL via INTRAVENOUS

## 2023-10-19 MED ORDER — SODIUM CHLORIDE 0.9 % IV SOLN: INTRAVENOUS | Status: DC

## 2023-10-19 MED ORDER — METRONIDAZOLE 500 MG/100ML IV SOLN
500.0000 mg | Freq: Once | INTRAVENOUS | Status: AC
  Administered 2023-10-19: 500 mg via INTRAVENOUS
  Filled 2023-10-19: qty 100

## 2023-10-19 MED ORDER — HYDROCODONE-ACETAMINOPHEN 5-325 MG PO TABS: 1.0000 | ORAL_TABLET | ORAL | Status: DC | PRN

## 2023-10-19 MED ORDER — ONDANSETRON HCL 4 MG PO TABS: 4.0000 mg | ORAL_TABLET | Freq: Four times a day (QID) | ORAL | Status: DC | PRN

## 2023-10-19 MED ORDER — LACTATED RINGERS IV SOLN: INTRAVENOUS | Status: DC

## 2023-10-19 MED ORDER — LACTATED RINGERS IV BOLUS (SEPSIS)
1000.0000 mL | Freq: Once | INTRAVENOUS | Status: AC
  Administered 2023-10-19: 1000 mL via INTRAVENOUS

## 2023-10-19 MED ORDER — POTASSIUM CHLORIDE CRYS ER 20 MEQ PO TBCR
40.0000 meq | EXTENDED_RELEASE_TABLET | Freq: Once | ORAL | Status: AC
  Administered 2023-10-19: 40 meq via ORAL
  Filled 2023-10-19: qty 2

## 2023-10-19 NOTE — ED Notes (Signed)
Pt transported to CT via stretcher.  

## 2023-10-19 NOTE — Progress Notes (Signed)
ED Pharmacy Antibiotic Sign Off An antibiotic consult was received from an ED provider for cefepime and vancomycin per pharmacy dosing for sepsis. A chart review was completed to assess appropriateness.  A single dose of cefepime and vancomycin placed by the ED provider.   The following one time order(s) were placed per pharmacy consult: none  Further antibiotic and/or antibiotic pharmacy consults should be ordered by the admitting provider if indicated.   Thank you for allowing pharmacy to be a part of this patient's care.   Delmar Landau, PharmD, BCPS 10/19/2023 4:42 PM ED Clinical Pharmacist -  [phone]

## 2023-10-19 NOTE — Sepsis Progress Note (Signed)
eLink is following this Code Sepsis. °

## 2023-10-19 NOTE — Sepsis Progress Note (Signed)
Per bedside RN [facility], lab reports not receiving blood for lactic acid testing that was sent around 1650. Redrawn and resulted 1.4.

## 2023-10-19 NOTE — Assessment & Plan Note (Signed)
-   most likely multifactorial secondary to combination of  infection  dehydration secondary to decreased by mouth intake,    - Will rehydrate   - treat underlining infection   - Hold contributing medications   - if no improvement may need further imaging to evaluate for CNS pathology pathology such as MRI of the brain   - neurological exam appears to be nonfocal but patient unable to cooperate fully   - VBG  ammonia pending

## 2023-10-19 NOTE — Assessment & Plan Note (Signed)
-  SIRS criteria met with  elevated white blood cell count,       Component Value Date/Time   WBC 13.3 (H) 10/19/2023 1606   LYMPHSABS 0.3 (L) 10/19/2023 1606    fever   RR >20 Today's Vitals   10/19/23 1925 10/19/23 2000 10/19/23 2047 10/19/23 2108  BP:  106/73  119/84  Pulse: 86 76  70  Resp: 16 18  16   Temp:    97.7 F (36.5 C)  TempSrc:    Oral  SpO2: 97% 97%  98%  Weight:      Height:      PainSc:   0-No pain     -Most likely source being:  urinary,   Patient meeting criteria for Severe sepsis with    evidence of end organ damage/organ dysfunction such as     acute metabolic encephalopathy    - Obtain serial lactic acid and procalcitonin level.  - Initiated IV antibiotics in ER: Antibiotics Given (last 72 hours)     Date/Time Action Medication Dose Rate   10/19/23 1647 New Bag/Given   ceFEPIme (MAXIPIME) 2 g in sodium chloride 0.9 % 100 mL IVPB 2 g 200 mL/hr   10/19/23 1737 New Bag/Given   metroNIDAZOLE (FLAGYL) IVPB 500 mg 500 mg 100 mL/hr   10/19/23 1903 New Bag/Given   vancomycin (VANCOCIN) IVPB 1000 mg/200 mL premix 1,000 mg 200 mL/hr       Will continue  on :   - await results of blood and urine culture  - Rehydrate aggressively  Intravenous fluids were administered      9:23 PM

## 2023-10-19 NOTE — Assessment & Plan Note (Signed)
Will rehydrate and continue to follow CK

## 2023-10-19 NOTE — Assessment & Plan Note (Signed)
Allow permissive hypertension for today ?

## 2023-10-19 NOTE — Assessment & Plan Note (Signed)
-   will replace electrolytes and repeat  check Mg, phos and Ca level and replace as needed Monitor on telemetry   Lab Results  Component Value Date   K 3.4 (L) 10/19/2023     Lab Results  Component Value Date   CREATININE 0.84 10/19/2023   Lab Results  Component Value Date   MG 1.8 10/19/2023   Lab Results  Component Value Date   CALCIUM 8.7 (L) 10/19/2023

## 2023-10-19 NOTE — Assessment & Plan Note (Signed)
Started on broad-spectrum antibiotics at [facility] For now will simplify down to Rocephin CT abdomen pelvis negative for renal abnormalities

## 2023-10-19 NOTE — Assessment & Plan Note (Signed)
Unclear etioloyg check lipid panel  may need follow up with GI as an out pt

## 2023-10-19 NOTE — H&P (Signed)
Douglas Chavez SWN:[id number] DOB: [birth year] DOA: 10/19/2023     PCP: [facility]    Patient arrived to ER on 10/19/23 at 1515 Referred by Attending Therisa Doyne, MD   Patient coming from:    home Lives  With family      Chief Complaint:   Chief Complaint  Patient presents with   Weakness    HPI: Douglas Chavez is a 77 y.o. male with medical history significant of HTN, BPH, heart murmur    Presented with   was found down 77 yo male found down for unclear time was able to ambulate 10 m After he was found down. Reported he felt weak all over for the past few days. Was unable to get out of the . Was up for the past 36 h bc he was helping his wife No localized neuro signs Code sepsis initiated CK 1700 UA? UTI Septic and started on broad spectrum Abx Of note has a new found parotid mass may need follow up with ENT Started on broad spectrum abx, feeling better Had similar presentaion in March with UTI   He has recently spent time with wife at the hospital has not eaten has not slept for long time he tried to get up to take a shower but could not His wife was hospitalized for bleeding No CP no fever at home no chills  But febrile in ER  Denies significant ETOH intake  now but used to 10 years ago Does  smoke a pack a day   but interested in quitting   Lab Results  Component Value Date   SARSCOV2NAA NEGATIVE 10/19/2023    Regarding pertinent Chronic problems:       HTN on lisinopril             Hepatic Function Panel     Component Value Date/Time   PROT 6.3 (L) 10/19/2023 1606   ALBUMIN 4.0 10/19/2023 1606   AST 52 (H) 10/19/2023 1606   ALT 15 10/19/2023 1606   ALKPHOS 62 10/19/2023 1606   BILITOT 1.5 (H) 10/19/2023 1606       BPH - on Flomax and Proscar      While in ER: Clinical Course as of 10/19/23 2119  Wed Oct 19, 2023  1558 Grover Canavan [phone] [CR]  1844 Consulted hospitalist Dr. Adela Glimpse who agreed with admission and assume  further treatment/care. [CR]    Clinical Course User Index [CR] Peter Garter, PA       Lab Orders         Resp panel by RT-PCR (RSV, Flu A&B, Covid) Anterior Nasal Swab         Blood Culture (routine x 2)         Urine Culture         CK         Comprehensive metabolic panel         CBC WITH DIFFERENTIAL         Urinalysis, Routine w reflex microscopic -Urine, Clean Catch         Protime-INR         APTT         Magnesium         CBG monitoring, ED      CT HEAD   NON acute  Did show parotid mass    CXR -  NON acute  CTabd/pelvis -  nonacute    Following Medications were ordered in ER: Medications  lactated ringers infusion ( Intravenous Handoff 10/19/23 2012)  lactated ringers bolus 1,000 mL (0 mLs Intravenous Stopped 10/19/23 1852)  ceFEPIme (MAXIPIME) 2 g in sodium chloride 0.9 % 100 mL IVPB (0 g Intravenous Stopped 10/19/23 1717)  metroNIDAZOLE (FLAGYL) IVPB 500 mg (0 mg Intravenous Stopped 10/19/23 1852)  vancomycin (VANCOCIN) IVPB 1000 mg/200 mL premix (1,000 mg Intravenous Handoff 10/19/23 2013)  acetaminophen (TYLENOL) tablet 1,000 mg (1,000 mg Oral Given 10/19/23 1652)  potassium chloride SA (KLOR-CON M) CR tablet 40 mEq (40 mEq Oral Given 10/19/23 1900)  iohexol (OMNIPAQUE) 300 MG/ML solution 100 mL (85 mLs Intravenous Contrast Given 10/19/23 1935)       ED Triage Vitals  Encounter Vitals Group     BP 10/19/23 1535 109/83     Systolic BP Percentile --      Diastolic BP Percentile --      Pulse Rate 10/19/23 1535 (!) 115     Resp 10/19/23 1540 18     Temp 10/19/23 1548 99.1 F (37.3 C)     Temp Source 10/19/23 1548 Oral     SpO2 10/19/23 1535 95 %     Weight 10/19/23 1538 160 lb (72.6 kg)     Height 10/19/23 1538 5\' 8"  (1.727 m)     Head Circumference --      Peak Flow --      Pain Score 10/19/23 1538 0     Pain Loc --      Pain Education --      Exclude from Growth Chart --   BMWU(13)@      _________________________________________ Significant initial  Findings: Abnormal Labs Reviewed  CK - Abnormal; Notable for the following components:      Result Value   Total CK 3,413 (*)    All other components within normal limits  COMPREHENSIVE METABOLIC PANEL - Abnormal; Notable for the following components:   Potassium 3.4 (*)    CO2 21 (*)    Glucose, Bld 103 (*)    BUN 24 (*)    Calcium 8.7 (*)    Total Protein 6.3 (*)    AST 52 (*)    Total Bilirubin 1.5 (*)    All other components within normal limits  CBC WITH DIFFERENTIAL/PLATELET - Abnormal; Notable for the following components:   WBC 13.3 (*)    Platelets 114 (*)    Neutro Abs 12.3 (*)    Lymphs Abs 0.3 (*)    All other components within normal limits  URINALYSIS, ROUTINE W REFLEX MICROSCOPIC - Abnormal; Notable for the following components:   APPearance HAZY (*)    Hgb urine dipstick LARGE (*)    Leukocytes,Ua SMALL (*)    Bacteria, UA RARE (*)    All other components within normal limits  CBG MONITORING, ED - Abnormal; Notable for the following components:   Glucose-Capillary 125 (*)    All other components within normal limits    _________________________ Troponin  ordered Cardiac Panel (last 3 results) Recent Labs    10/19/23 1606  CKTOTAL 3,413*     ECG: Ordered Personally reviewed and interpreted by me showing: HR : *** Rhythm: *NSR, Sinus tachycardia * A.fib. W RVR, RBBB, LBBB, Paced Ischemic changes*nonspecific changes, no evidence of ischemic changes QTC*  BNP (last 3 results) No results for input(s): "BNP" in the last 8760 hours.   COVID-19 Labs  No results for input(s): "DDIMER", "FERRITIN", "LDH", "CRP" in the last 72 hours.  Lab Results  Component Value Date  SARSCOV2NAA NEGATIVE 10/19/2023     ____________________ This patient meets SIRS Criteria and may be septic.   The recent clinical data is shown below. Vitals:   10/19/23 1915 10/19/23 1925 10/19/23 2000 10/19/23 2108   BP: 109/79  106/73 119/84  Pulse:  86 76 70  Resp: 13 16 18 16   Temp:    97.7 F (36.5 C)  TempSrc:    Oral  SpO2:  97% 97% 98%  Weight:      Height:        WBC     Component Value Date/Time   WBC 13.3 (H) 10/19/2023 1606   LYMPHSABS 0.3 (L) 10/19/2023 1606   MONOABS 0.6 10/19/2023 1606   EOSABS 0.0 10/19/2023 1606   BASOSABS 0.0 10/19/2023 1606    Lactic Acid, Venous    Component Value Date/Time   LATICACIDVEN 1.4 10/19/2023 1809     Procalcitonin   Ordered      UA   evidence of UTI    Urine analysis:    Component Value Date/Time   COLORURINE YELLOW 10/19/2023 1646   APPEARANCEUR HAZY (A) 10/19/2023 1646   LABSPEC 1.016 10/19/2023 1646   PHURINE 7.0 10/19/2023 1646   GLUCOSEU NEGATIVE 10/19/2023 1646   HGBUR LARGE (A) 10/19/2023 1646   BILIRUBINUR NEGATIVE 10/19/2023 1646   KETONESUR NEGATIVE 10/19/2023 1646   PROTEINUR NEGATIVE 10/19/2023 1646   NITRITE NEGATIVE 10/19/2023 1646   LEUKOCYTESUR SMALL (A) 10/19/2023 1646    Results for orders placed or performed during the hospital encounter of 10/19/23  Resp panel by RT-PCR (RSV, Flu A&B, Covid) Anterior Nasal Swab     Status: None   Collection Time: 10/19/23  4:46 PM   Specimen: Anterior Nasal Swab  Result Value Ref Range Status   SARS Coronavirus 2 by RT PCR NEGATIVE NEGATIVE Final         Influenza A by PCR NEGATIVE NEGATIVE Final   Influenza B by PCR NEGATIVE NEGATIVE Final         Resp Syncytial Virus by PCR NEGATIVE NEGATIVE Final           ABX started Antibiotics Given (last 72 hours)     Date/Time Action Medication Dose Rate   10/19/23 1647 New Bag/Given   ceFEPIme (MAXIPIME) 2 g in sodium chloride 0.9 % 100 mL IVPB 2 g 200 mL/hr   10/19/23 1737 New Bag/Given   metroNIDAZOLE (FLAGYL) IVPB 500 mg 500 mg 100 mL/hr   10/19/23 1903 New Bag/Given   vancomycin (VANCOCIN) IVPB 1000 mg/200 mL premix 1,000 mg 200 mL/hr        Arterial ***Venous  Blood Gas result:  pH *** pCO2 ***; pO2 ***;      %O2 Sat ***.  ABG No results found for: "PHART", "PCO2ART", "PO2ART", "HCO3", "TCO2", "ACIDBASEDEF", "O2SAT"   __________________________________________________________ Recent Labs  Lab 10/19/23 1606  NA 137  K 3.4*  CO2 21*  GLUCOSE 103*  BUN 24*  CREATININE 0.84  CALCIUM 8.7*  MG 1.8    Cr   stable,    Lab Results  Component Value Date   CREATININE 0.84 10/19/2023    Recent Labs  Lab 10/19/23 1606  AST 52*  ALT 15  ALKPHOS 62  BILITOT 1.5*  PROT 6.3*  ALBUMIN 4.0   Lab Results  Component Value Date   CALCIUM 8.7 (L) 10/19/2023    Plt: Lab Results  Component Value Date   PLT 114 (L) 10/19/2023    Recent Labs  Lab 10/19/23 1606  WBC 13.3*  NEUTROABS 12.3*  HGB 14.5  HCT 41.8  MCV 92.1  PLT 114*    HG/HCT  stable,     Component Value Date/Time   HGB 14.5 10/19/2023 1606   HCT 41.8 10/19/2023 1606   MCV 92.1 10/19/2023 1606    No results for input(s): "AMMONIA" in the last 168 hours.    _______________________________________________ Hospitalist was called for admission for   Parotid mass  Altered mental status rhabdomyolysis    Sepsis  Acute cystitis with hematuria    The following Work up has been ordered so far:  Orders Placed This Encounter  Procedures   Critical Care   Resp panel by RT-PCR (RSV, Flu A&B, Covid) Anterior Nasal Swab   Blood Culture (routine x 2)   Urine Culture   CT Head Wo Contrast   DG Chest 2 View   CT Cervical Spine Wo Contrast   CT ABDOMEN PELVIS W CONTRAST   CK   Comprehensive metabolic panel   CBC WITH DIFFERENTIAL   Urinalysis, Routine w reflex microscopic -Urine, Clean Catch   Protime-INR   APTT   Magnesium   Diet NPO time specified   Check Rectal Temperature   Bladder scan   ED Cardiac monitoring   Initiate Carrier Fluid Protocol   Document height and weight   Assess and Document Glasgow Coma Scale   Document vital signs within 1-hour of fluid bolus completion. Notify provider of abnormal  vital signs despite fluid resuscitation.   DO NOT delay antibiotics if unable to obtain blood culture.   Refer to Sidebar Report: Sepsis Sidebar ED/IP   Notify provider for difficulties obtaining IV access.   In and Out Cath   Cardiac Monitoring - Continuous Indefinite   Code Sepsis activation.  This occurs automatically when order is signed and prioritizes pharmacy, lab, and radiology services for STAT collections and interventions.  If [facility] downtime, call [facility] [phone]) to activate Code Sepsis.   monitoring by pharmacy   Consult to hospitalist   CBG monitoring, ED   EKG 12-Lead   ED EKG   EKG   EKG   Insert peripheral IV   Place in observation (patient's expected length of stay will be less than 2 midnights)     OTHER Significant initial  Findings:  labs showing:     DM  labs:  HbA1C: No results for input(s): "HGBA1C" in the last 8760 hours.     CBG (last 3)  Recent Labs    10/19/23 1645  GLUCAP 125*          Cultures: No results found for: "SDES", "SPECREQUEST", "CULT", "REPTSTATUS"   Radiological Exams on Admission: CT ABDOMEN PELVIS W CONTRAST  Result Date: 10/19/2023 CLINICAL DATA:  Acute nonlocalized abdominal pain EXAM: CT ABDOMEN AND PELVIS WITH CONTRAST TECHNIQUE: Multidetector CT imaging of the abdomen and pelvis was performed using the standard protocol following bolus administration of intravenous contrast. RADIATION DOSE REDUCTION: This exam was performed according to the departmental dose-optimization program which includes automated exposure control, adjustment of the mA and/or kV according to patient size and/or use of iterative reconstruction technique. CONTRAST:  85mL OMNIPAQUE IOHEXOL 300 MG/ML  SOLN COMPARISON:  None Available. FINDINGS: Lower chest: No acute abnormality. Hepatobiliary: Hepatic steatosis. Large gallstones in the gallbladder without evidence of acute cholecystitis. No biliary dilation. Pancreas: Unremarkable. Spleen:  Unremarkable. Adrenals/Urinary Tract: Normal adrenal glands. No urinary calculi or hydronephrosis. Right posterior bladder diverticulum. Stomach/Bowel: Normal caliber large and small bowel. No bowel wall thickening.  The appendix is normal.Stomach is within normal limits. Vascular/Lymphatic: No significant vascular findings are present. No enlarged abdominal or pelvic lymph nodes. Reproductive: Enlarged prostate Other: No free intraperitoneal fluid or air. Musculoskeletal: No acute fracture. IMPRESSION: 1. No acute abnormality in the abdomen or pelvis. 2. Hepatic steatosis. 3. Cholelithiasis without evidence of acute cholecystitis. 4. Enlarged prostate. Electronically Signed   By: Minerva Fester M.D.   On: 10/19/2023 20:11   DG Chest 2 View  Result Date: 10/19/2023 CLINICAL DATA:  Found on floor in bathroom today.  Nonresponsive. EXAM: CHEST - 2 VIEW COMPARISON:  05/11/2017 FINDINGS: The heart size is normal. Stable ectasia of thoracic aorta. Both lungs are clear. The visualized skeletal structures are unremarkable. IMPRESSION: No active cardiopulmonary disease. Electronically Signed   By: Danae Orleans M.D.   On: 10/19/2023 16:50   CT Head Wo Contrast  Result Date: 10/19/2023 CLINICAL DATA:  Mental status change, unknown cause; Neck trauma (Age >= 65y) EXAM: CT HEAD WITHOUT CONTRAST CT CERVICAL SPINE WITHOUT CONTRAST TECHNIQUE: Multidetector CT imaging of the head and cervical spine was performed following the standard protocol without intravenous contrast. Multiplanar CT image reconstructions of the cervical spine were also generated. RADIATION DOSE REDUCTION: This exam was performed according to the departmental dose-optimization program which includes automated exposure control, adjustment of the mA and/or kV according to patient size and/or use of iterative reconstruction technique. COMPARISON:  None Available. FINDINGS: CT HEAD FINDINGS Brain: No evidence of acute infarction, hemorrhage,  hydrocephalus, extra-axial collection or mass lesion/mass effect. Vascular: No hyperdense vessel or unexpected calcification. Skull: No acute fracture. Sinuses/Orbits: Right maxillary sinus mucosal thickening. No acute orbital findings. Other: Right parotid mass is seen on the CT of the cervical spine and described below. CT CERVICAL SPINE FINDINGS Alignment: No substantial sagittal subluxation. Skull base and vertebrae: No acute fracture. Vertebral body heights are maintained. Soft tissues and spinal canal: No prevertebral fluid or swelling. No visible canal hematoma. Disc levels:  Moderate multilevel degenerative change. Upper chest: Visualized lung apices are clear. Other: Approximately 2.1 cm right parotid mass. Superficial 2.7 cm low-attenuation lesion in the right lower neck. IMPRESSION: 1. No evidence of acute traumatic abnormality intracranially or in the cervical spine 2. 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 Recommend ENT consultation for management. 3. Superficial 2.7 cm low-attenuation lesion in the right lower neck, most likely of dermal origin (such as a sebaceous cyst or similar). Electronically Signed   By: Feliberto Harts M.D.   On: 10/19/2023 16:44   CT Cervical Spine Wo Contrast  Result Date: 10/19/2023 CLINICAL DATA:  Mental status change, unknown cause; Neck trauma (Age >= 65y) EXAM: CT HEAD WITHOUT CONTRAST CT CERVICAL SPINE WITHOUT CONTRAST TECHNIQUE: Multidetector CT imaging of the head and cervical spine was performed following the standard protocol without intravenous contrast. Multiplanar CT image reconstructions of the cervical spine were also generated. RADIATION DOSE REDUCTION: This exam was performed according to the departmental dose-optimization program which includes automated exposure control, adjustment of the mA and/or kV according to patient size and/or use of iterative reconstruction technique. COMPARISON:  None Available.  FINDINGS: CT HEAD FINDINGS Brain: No evidence of acute infarction, hemorrhage, hydrocephalus, extra-axial collection or mass lesion/mass effect. Vascular: No hyperdense vessel or unexpected calcification. Skull: No acute fracture. Sinuses/Orbits: Right maxillary sinus mucosal thickening. No acute orbital findings. Other: Right parotid mass is seen on the CT of the cervical spine and described below. CT CERVICAL SPINE FINDINGS Alignment: No substantial sagittal  subluxation. Skull base and vertebrae: No acute fracture. Vertebral body heights are maintained. Soft tissues and spinal canal: No prevertebral fluid or swelling. No visible canal hematoma. Disc levels:  Moderate multilevel degenerative change. Upper chest: Visualized lung apices are clear. Other: Approximately 2.1 cm right parotid mass. Superficial 2.7 cm low-attenuation lesion in the right lower neck. IMPRESSION: 1. No evidence of acute traumatic abnormality intracranially or in the cervical spine 2. 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 Recommend ENT consultation for management. 3. Superficial 2.7 cm low-attenuation lesion in the right lower neck, most likely of dermal origin (such as a sebaceous cyst or similar). Electronically Signed   By: Feliberto Harts M.D.   On: 10/19/2023 16:44   _______________________________________________________________________________________________________ Latest  Blood pressure 119/84, pulse 70, temperature 97.7 F (36.5 C), temperature source Oral, resp. rate 16, height 5\' 8"  (1.727 m), weight 72.6 kg, SpO2 98%.   Vitals  labs and radiology finding personally reviewed  Review of Systems:    Pertinent positives include: confusion  Constitutional:  No weight loss, night sweats, Fevers, chills, fatigue, weight loss  HEENT:  No headaches, Difficulty swallowing,Tooth/dental problems,Sore throat,  No sneezing, itching, ear ache, nasal congestion, post nasal drip,   Cardio-vascular:  No chest pain, Orthopnea, PND, anasarca, dizziness, palpitations.no Bilateral lower extremity swelling  GI:  No heartburn, indigestion, abdominal pain, nausea, vomiting, diarrhea, change in bowel habits, loss of appetite, melena, blood in stool, hematemesis Resp:  no shortness of breath at rest. No dyspnea on exertion, No excess mucus, no productive cough, No non-productive cough, No coughing up of blood.No change in color of mucus.No wheezing. Skin:  no rash or lesions. No jaundice GU:  no dysuria, change in color of urine, no urgency or frequency. No straining to urinate.  No flank pain.  Musculoskeletal:  No joint pain or no joint swelling. No decreased range of motion. No back pain.  Psych:  No change in mood or affect. No depression or anxiety. No memory loss.  Neuro: no localizing neurological complaints, no tingling, no weakness, no double vision, no gait abnormality, no slurred speech,   All systems reviewed and apart from HOPI all are negative _______________________________________________________________________________________________ Past Medical History:   Past Medical History:  Diagnosis Date   Hypertension    Kidney stones       History reviewed. No pertinent surgical history.  Social History:  Ambulatory   independently      reports that he has been smoking cigarettes. He has never used smokeless tobacco. He reports that he does not drink alcohol and does not use drugs.     Family History:   History reviewed. No pertinent family history. ______________________________________________________________________________________________ Allergies: No Known Allergies   Prior to Admission medications   Medication Sig Start Date End Date Taking? Authorizing Provider  cephALEXin (KEFLEX) 500 MG capsule Take 500 mg by mouth 2 (two) times daily.    [provider]  cyclobenzaprine (FLEXERIL) 10 MG tablet Take 1 tablet (10 mg total) by  mouth 2 (two) times daily as needed for muscle spasms. 05/11/17   Alvira Monday, MD  HYDROcodone-acetaminophen (NORCO/VICODIN) 5-325 MG per tablet Take 1-2 tablets by mouth every 4 (four) hours as needed. 04/15/15   Marlon Pel, PA-C  ibuprofen (ADVIL,MOTRIN) 200 MG tablet Take 400 mg by mouth every 6 (six) hours as needed for mild pain.    [provider]  lisinopril (PRINIVIL,ZESTRIL) 20 MG tablet Take 20 mg by mouth daily.  [provider]    ___________________________________________________________________________________________________ Physical Exam:    10/19/2023    9:08 PM 10/19/2023    8:00 PM 10/19/2023    7:25 PM  Vitals with BMI  Systolic 119 106   Diastolic 84 73   Pulse 70 76 86    1. General:  in No  Acute distress   Chronically ill   -appearing 2. Psychological: Alert and   Oriented 3. Head/ENT:   Dry Mucous Membranes                          Head Non traumatic, neck supple                        Poor Dentition 4. SKIN:  decreased Skin turgor,  Skin clean Dry and intact no rash    5. Heart: Regular rate and rhythm no*** Murmur, no Rub or gallop 6. Lungs:   no wheezes or crackles   7. Abdomen: Soft, ***non-tender, Non distended *** obese ***bowel sounds present 8. Lower extremities: no clubbing, cyanosis, no ***edema 9. Neurologically Grossly intact, moving all 4 extremities equally *** strength 5 out of 5 in all 4 extremities cranial nerves II through XII intact 10. MSK: Normal range of motion    Chart has been reviewed  ______________________________________________________________________________________________  Assessment/Plan 77 y.o. male with medical history significant of HTN, BPH, heart murmur   Admitted for   Parotid mass  Altered mental status rhabdomyolysis    Sepsis  Acute cystitis with hematuria    Present on Admission:  Sepsis (HCC)  Primary hypertension  UTI (urinary tract infection)  Parotid mass  Acute  encephalopathy  Rhabdomyolysis  Tobacco abuse  Hypokalemia     Sepsis (HCC)  -SIRS criteria met with  elevated white blood cell count,       Component Value Date/Time   WBC 13.3 (H) 10/19/2023 1606   LYMPHSABS 0.3 (L) 10/19/2023 1606    fever   RR >20 Today's Vitals   10/19/23 1925 10/19/23 2000 10/19/23 2047 10/19/23 2108  BP:  106/73  119/84  Pulse: 86 76  70  Resp: 16 18  16   Temp:    97.7 F (36.5 C)  TempSrc:    Oral  SpO2: 97% 97%  98%  Weight:      Height:      PainSc:   0-No pain     -Most likely source being:  urinary,   Patient meeting criteria for Severe sepsis with    evidence of end organ damage/organ dysfunction such as     acute metabolic encephalopathy    - Obtain serial lactic acid and procalcitonin level.  - Initiated IV antibiotics in ER: Antibiotics Given (last 72 hours)     Date/Time Action Medication Dose Rate   10/19/23 1647 New Bag/Given   ceFEPIme (MAXIPIME) 2 g in sodium chloride 0.9 % 100 mL IVPB 2 g 200 mL/hr   10/19/23 1737 New Bag/Given   metroNIDAZOLE (FLAGYL) IVPB 500 mg 500 mg 100 mL/hr   10/19/23 1903 New Bag/Given   vancomycin (VANCOCIN) IVPB 1000 mg/200 mL premix 1,000 mg 200 mL/hr       Will continue  on :   - await results of blood and urine culture  - Rehydrate aggressively  Intravenous fluids were administered      9:23 PM   Primary hypertension Allow permissive hypertension for today  UTI (urinary tract infection) Started on  broad-spectrum antibiotics at [facility] For now will simplify down to Rocephin CT abdomen pelvis negative for renal abnormalities   Parotid mass Will need outpatient follow-up with ENT and possibly biopsy  Acute encephalopathy   - most likely multifactorial secondary to combination of  infection  dehydration secondary to decreased by mouth intake,    - Will rehydrate   - treat underlining infection   - Hold contributing medications   - if no improvement may need further imaging to  evaluate for CNS pathology pathology such as MRI of the brain   - neurological exam appears to be nonfocal but patient unable to cooperate fully   - VBG  ammonia pending   Hepatic steatosis Unclear etioloyg check lipid panel  may need follow up with GI as an out pt   Rhabdomyolysis Will rehydrate and continue to follow CK  Tobacco abuse  - Spoke about importance of quitting spent 5 minutes discussing options for treatment, prior attempts at quitting, and dangers of smoking  -At this point patient is    interested in quitting  - refused nicotine patch   - nursing tobacco cessation protocol   Hypokalemia - will replace electrolytes and repeat  check Mg, phos and Ca level and replace as needed Monitor on telemetry   Lab Results  Component Value Date   K 3.4 (L) 10/19/2023     Lab Results  Component Value Date   CREATININE 0.84 10/19/2023   Lab Results  Component Value Date   MG 1.8 10/19/2023   Lab Results  Component Value Date   CALCIUM 8.7 (L) 10/19/2023      Other plan as per orders.  DVT prophylaxis:  SCD      Code Status:    Code Status: Not on file FULL CODE  as per patient  I had personally discussed CODE STATUS with patient   ACP   none    Family Communication:   Family not at  Bedside  plan of care was discussed on the phone with *** Son, Daughter, Wife, Husband, Sister, Brother , father, mother  Diet  Diet Orders (From admission, onward)     Start     Ordered   10/19/23 1632  Diet NPO time specified  (Septic presentation on arrival (screening labs, nursing and treatment orders for obvious sepsis))  Diet effective now        10/19/23 1633            Disposition Plan:       To home once workup is complete and patient is stable   Following barriers for discharge:                                                         Electrolytes corrected                                 Afebrile, white count improving able to transition to PO antibiotics                                     Consult Orders  (From admission, onward)           Start  Ordered   10/19/23 1804  Consult to hospitalist  Called [facility] for hospitalist consult spoke with   [facility]     @6 :07p  Once       Provider:  (Not yet assigned)  Question Answer Comment  Place call to: [facility]   Reason for Consult Admit      10/19/23 1803                               Would benefit from PT/OT eval prior to DC  Ordered                                       Consults called: none     Admission status:  ED Disposition     ED Disposition  Admit   Condition  --   Comment  Hospital Area: [facility] [[id number]]  Level of Care: Progressive [102]  Admit to Progressive based on following criteria: NEPHROLOGY stable condition requiring close monitoring for AKI, requiring Hemodialysis or Peritoneal Dialysis either from expected electrolyte imbalance, acidosis, or fluid overload that can be managed by NIPPV or high flow oxygen.  Admit to Progressive based on following criteria: MULTISYSTEM THREATS such as stable sepsis, metabolic/electrolyte imbalance with or without encephalopathy that is responding to early treatment.  Interfacility transfer: Yes  May place patient in observation at [facility] or [facility] if equivalent level of care is available:: Yes  Covid Evaluation: Confirmed COVID Negative  Diagnosis: Sepsis [facility]) [[id number]]  Admitting Physician: Therisa Doyne [[id number]]  Attending Physician: Therisa Doyne [[id number]]           obser    Level of care    progressive  tele indefinitely please discontinue once patient no longer qualifies COVID-19 Labs    Lab Results  Component Value Date   SARSCOV2NAA NEGATIVE 10/19/2023     Precautions: admitted as   Covid Negative   Dewaine Morocho 10/19/2023, 9:48 PM ***  [facility]     after 2 AM please page floor coverage PA If 7AM-7PM, please contact the day  team taking care of the patient using [email or web address]

## 2023-10-19 NOTE — ED Notes (Signed)
Called [facility] to transport patient to [facility] rm# [phone]

## 2023-10-19 NOTE — Assessment & Plan Note (Signed)
-  Spoke about importance of quitting spent 5 minutes discussing options for treatment, prior attempts at quitting, and dangers of smoking  -At this point patient is    interested in quitting  - refused nicotine patch   - nursing tobacco cessation protocol

## 2023-10-19 NOTE — Assessment & Plan Note (Signed)
Will need outpatient follow-up with ENT and possibly biopsy

## 2023-10-19 NOTE — ED Provider Notes (Signed)
[place] EMERGENCY DEPARTMENT AT [facility] Provider Note   CSN: [id number] Arrival date & time: 10/19/23  1515     History  Chief Complaint  Patient presents with   Weakness    Douglas Chavez is a 77 y.o. male.   Weakness   77 year old male presents emergency department via EMS after being found on the ground in the bathroom with inability to respond coherently.  Patient recently was with his wife in the hospital yesterday after she was admitted in the [facility].  Daughter states that he did not get home until the early hours of the morning.  Daughter was living in the same house of the patient.  Sometime between patient getting home and daughter waking up around 8 AM, daughter found patient in the bathtub with a shower curtain in disarray.  Patient was complaining of weakness but unable to verbally describe what happened.  Symptoms lasted for around 10 minutes before patient was able to communicate more clearly after being found down.  Unknown amount of time the patient was on the ground.  Patient states that he felt like he bent over the bathtub because he felt weak and subsequently lowered himself into the tub.  Does not think that he fell.Marland Kitchen  He is not currently complaining of any pain.  Denies visual disturbance, gait abnormality, slurred speech, facial droop, unilateral weakness/sensory deficits.  States that he has felt weak all over for the past couple of days.  Denies any chest pain, shortness of breath, abdominal pain, nausea, vomiting, urinary symptoms, change in bowel habits.  Denies any recent medication changes.  States that he is on medication for BPH as well as hypertension but otherwise, no chronic medications.  Denies any alcohol or illicit substance use.  Past medical history significant for BPH, hypertension, nephrolithiasis  Home Medications Prior to Admission medications   Medication Sig Start Date End Date Taking? Authorizing Provider   betamethasone dipropionate 0.05 % cream Apply 1 application  topically 2 (two) times daily as needed (for itching/irritated areas of bilateral lower legs).   Yes [provider]  finasteride (PROSCAR) 5 MG tablet Take 5 mg by mouth daily.   Yes [provider]  lisinopril (ZESTRIL) 10 MG tablet Take 10 mg by mouth daily.   Yes [provider]  tamsulosin (FLOMAX) 0.4 MG CAPS capsule Take 0.8 mg by mouth daily after breakfast.   Yes [provider]  cyclobenzaprine (FLEXERIL) 10 MG tablet Take 1 tablet (10 mg total) by mouth 2 (two) times daily as needed for muscle spasms. Patient not taking: Reported on 10/19/2023 05/11/17   Alvira Monday, MD  HYDROcodone-acetaminophen (NORCO/VICODIN) 5-325 MG per tablet Take 1-2 tablets by mouth every 4 (four) hours as needed. Patient not taking: Reported on 10/19/2023 04/15/15   Marlon Pel, PA-C      Allergies    Patient has no known allergies.    Review of Systems   Review of Systems  Neurological:  Positive for weakness.  All other systems reviewed and are negative.   Physical Exam Updated Vital Signs BP 137/83 (BP Location: Left Arm)   Pulse 63   Temp 97.6 F (36.4 C) (Oral)   Resp 16   Ht 5\' 8"  (1.727 m)   Wt 72.6 kg   SpO2 98%   BMI 24.33 kg/m  Physical Exam Vitals and nursing note reviewed.  Constitutional:      General: He is not in acute distress.    Appearance: He  is well-developed.  HENT:     Head: Normocephalic and atraumatic.  Eyes:     Conjunctiva/sclera: Conjunctivae normal.  Cardiovascular:     Rate and Rhythm: Regular rhythm. Tachycardia present.  Pulmonary:     Effort: Pulmonary effort is normal. No respiratory distress.     Breath sounds: Normal breath sounds.  Abdominal:     Palpations: Abdomen is soft.     Tenderness: There is no abdominal tenderness.  Musculoskeletal:        General: No swelling.     Cervical back: Neck supple.     Comments: No midline tenderness of  cervical, thoracic, lumbar spine without step-off or deformity.  No chest wall tenderness.  Patient moves all 4 extremities.  No overlying bony tenderness of upper or lower extremities.  Muscle strength symmetric bilateral upper and lower extremities.  No obvious erythema, fluctuance/induration throughout full-body exam.  No obvious swelling, ecchymosis/hematoma appreciated during full-body exam.  Skin:    General: Skin is warm and dry.     Capillary Refill: Capillary refill takes less than 2 seconds.  Neurological:     Mental Status: He is alert.     Comments: Alert and oriented to self, place  Speech is fluent, clear without dysarthria or dysphasia.   Strength symmetric in upper/lower extremities   Sensation intact in upper/lower extremities   Gait not assessed CN I not tested  CN II not tested  CN III, IV, VI PERRLA and EOMs intact bilaterally  CN V Intact sensation to sharp and light touch to the face  CN VII facial movements symmetric  CN VIII not tested  CN IX, X no uvula deviation, symmetric rise of soft palate  CN XI symmetric SCM and trapezius strength bilaterally  CN XII Midline tongue protrusion, symmetric L/R movements     Psychiatric:        Mood and Affect: Mood normal.     ED Results / Procedures / Treatments   Labs (all labs ordered are listed, but only abnormal results are displayed) Labs Reviewed  CK - Abnormal; Notable for the following components:      Result Value   Total CK 3,413 (*)    All other components within normal limits  COMPREHENSIVE METABOLIC PANEL - Abnormal; Notable for the following components:   Potassium 3.4 (*)    CO2 21 (*)    Glucose, Bld 103 (*)    BUN 24 (*)    Calcium 8.7 (*)    Total Protein 6.3 (*)    AST 52 (*)    Total Bilirubin 1.5 (*)    All other components within normal limits  CBC WITH DIFFERENTIAL/PLATELET - Abnormal; Notable for the following components:   WBC 13.3 (*)    Platelets 114 (*)    Neutro Abs 12.3 (*)     Lymphs Abs 0.3 (*)    All other components within normal limits  URINALYSIS, ROUTINE W REFLEX MICROSCOPIC - Abnormal; Notable for the following components:   APPearance HAZY (*)    Hgb urine dipstick LARGE (*)    Leukocytes,Ua SMALL (*)    Bacteria, UA RARE (*)    All other components within normal limits  CK - Abnormal; Notable for the following components:   Total CK 5,827 (*)    All other components within normal limits  BASIC METABOLIC PANEL - Abnormal; Notable for the following components:   CO2 21 (*)    Glucose, Bld 129 (*)    BUN 24 (*)  Calcium 8.4 (*)    All other components within normal limits  BLOOD GAS, VENOUS - Abnormal; Notable for the following components:   pCO2, Ven 39 (*)    pO2, Ven 49 (*)    All other components within normal limits  CK - Abnormal; Notable for the following components:   Total CK 4,713 (*)    All other components within normal limits  COMPREHENSIVE METABOLIC PANEL - Abnormal; Notable for the following components:   Sodium 133 (*)    Glucose, Bld 108 (*)    BUN 30 (*)    Calcium 8.0 (*)    Total Protein 5.6 (*)    Albumin 3.0 (*)    AST 100 (*)    All other components within normal limits  CBC - Abnormal; Notable for the following components:   WBC 11.3 (*)    RBC 3.91 (*)    Hemoglobin 12.6 (*)    HCT 38.4 (*)    Platelets 99 (*)    All other components within normal limits  CBG MONITORING, ED - Abnormal; Notable for the following components:   Glucose-Capillary 125 (*)    All other components within normal limits  TROPONIN I (HIGH SENSITIVITY) - Abnormal; Notable for the following components:   Troponin I (High Sensitivity) 49 (*)    All other components within normal limits  RESP PANEL BY RT-PCR (RSV, FLU A&B, COVID)  RVPGX2  CULTURE, BLOOD (ROUTINE X 2)  CULTURE, BLOOD (ROUTINE X 2)  URINE CULTURE  LACTIC ACID, PLASMA  PROTIME-INR  APTT  MAGNESIUM  PHOSPHORUS  AMMONIA  TSH  OSMOLALITY  PROCALCITONIN  MAGNESIUM   PHOSPHORUS    EKG None  Radiology CT ABDOMEN PELVIS W CONTRAST  Result Date: 10/19/2023 CLINICAL DATA:  Acute nonlocalized abdominal pain EXAM: CT ABDOMEN AND PELVIS WITH CONTRAST TECHNIQUE: Multidetector CT imaging of the abdomen and pelvis was performed using the standard protocol following bolus administration of intravenous contrast. RADIATION DOSE REDUCTION: This exam was performed according to the departmental dose-optimization program which includes automated exposure control, adjustment of the mA and/or kV according to patient size and/or use of iterative reconstruction technique. CONTRAST:  85mL OMNIPAQUE IOHEXOL 300 MG/ML  SOLN COMPARISON:  None Available. FINDINGS: Lower chest: No acute abnormality. Hepatobiliary: Hepatic steatosis. Large gallstones in the gallbladder without evidence of acute cholecystitis. No biliary dilation. Pancreas: Unremarkable. Spleen: Unremarkable. Adrenals/Urinary Tract: Normal adrenal glands. No urinary calculi or hydronephrosis. Right posterior bladder diverticulum. Stomach/Bowel: Normal caliber large and small bowel. No bowel wall thickening. The appendix is normal.Stomach is within normal limits. Vascular/Lymphatic: No significant vascular findings are present. No enlarged abdominal or pelvic lymph nodes. Reproductive: Enlarged prostate Other: No free intraperitoneal fluid or air. Musculoskeletal: No acute fracture. IMPRESSION: 1. No acute abnormality in the abdomen or pelvis. 2. Hepatic steatosis. 3. Cholelithiasis without evidence of acute cholecystitis. 4. Enlarged prostate. Electronically Signed   By: Minerva Fester M.D.   On: 10/19/2023 20:11   DG Chest 2 View  Result Date: 10/19/2023 CLINICAL DATA:  Found on floor in bathroom today.  Nonresponsive. EXAM: CHEST - 2 VIEW COMPARISON:  05/11/2017 FINDINGS: The heart size is normal. Stable ectasia of thoracic aorta. Both lungs are clear. The visualized skeletal structures are unremarkable. IMPRESSION: No  active cardiopulmonary disease. Electronically Signed   By: Danae Orleans M.D.   On: 10/19/2023 16:50   CT Head Wo Contrast  Result Date: 10/19/2023 CLINICAL DATA:  Mental status change, unknown cause; Neck trauma (Age >= 65y) EXAM:  CT HEAD WITHOUT CONTRAST CT CERVICAL SPINE WITHOUT CONTRAST TECHNIQUE: Multidetector CT imaging of the head and cervical spine was performed following the standard protocol without intravenous contrast. Multiplanar CT image reconstructions of the cervical spine were also generated. RADIATION DOSE REDUCTION: This exam was performed according to the departmental dose-optimization program which includes automated exposure control, adjustment of the mA and/or kV according to patient size and/or use of iterative reconstruction technique. COMPARISON:  None Available. FINDINGS: CT HEAD FINDINGS Brain: No evidence of acute infarction, hemorrhage, hydrocephalus, extra-axial collection or mass lesion/mass effect. Vascular: No hyperdense vessel or unexpected calcification. Skull: No acute fracture. Sinuses/Orbits: Right maxillary sinus mucosal thickening. No acute orbital findings. Other: Right parotid mass is seen on the CT of the cervical spine and described below. CT CERVICAL SPINE FINDINGS Alignment: No substantial sagittal subluxation. Skull base and vertebrae: No acute fracture. Vertebral body heights are maintained. Soft tissues and spinal canal: No prevertebral fluid or swelling. No visible canal hematoma. Disc levels:  Moderate multilevel degenerative change. Upper chest: Visualized lung apices are clear. Other: Approximately 2.1 cm right parotid mass. Superficial 2.7 cm low-attenuation lesion in the right lower neck. IMPRESSION: 1. No evidence of acute traumatic abnormality intracranially or in the cervical spine 2. 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 Recommend ENT consultation for management. 3. Superficial 2.7 cm low-attenuation lesion  in the right lower neck, most likely of dermal origin (such as a sebaceous cyst or similar). Electronically Signed   By: Feliberto Harts M.D.   On: 10/19/2023 16:44   CT Cervical Spine Wo Contrast  Result Date: 10/19/2023 CLINICAL DATA:  Mental status change, unknown cause; Neck trauma (Age >= 65y) EXAM: CT HEAD WITHOUT CONTRAST CT CERVICAL SPINE WITHOUT CONTRAST TECHNIQUE: Multidetector CT imaging of the head and cervical spine was performed following the standard protocol without intravenous contrast. Multiplanar CT image reconstructions of the cervical spine were also generated. RADIATION DOSE REDUCTION: This exam was performed according to the departmental dose-optimization program which includes automated exposure control, adjustment of the mA and/or kV according to patient size and/or use of iterative reconstruction technique. COMPARISON:  None Available. FINDINGS: CT HEAD FINDINGS Brain: No evidence of acute infarction, hemorrhage, hydrocephalus, extra-axial collection or mass lesion/mass effect. Vascular: No hyperdense vessel or unexpected calcification. Skull: No acute fracture. Sinuses/Orbits: Right maxillary sinus mucosal thickening. No acute orbital findings. Other: Right parotid mass is seen on the CT of the cervical spine and described below. CT CERVICAL SPINE FINDINGS Alignment: No substantial sagittal subluxation. Skull base and vertebrae: No acute fracture. Vertebral body heights are maintained. Soft tissues and spinal canal: No prevertebral fluid or swelling. No visible canal hematoma. Disc levels:  Moderate multilevel degenerative change. Upper chest: Visualized lung apices are clear. Other: Approximately 2.1 cm right parotid mass. Superficial 2.7 cm low-attenuation lesion in the right lower neck. IMPRESSION: 1. No evidence of acute traumatic abnormality intracranially or in the cervical spine 2. 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 Recommend  ENT consultation for management. 3. Superficial 2.7 cm low-attenuation lesion in the right lower neck, most likely of dermal origin (such as a sebaceous cyst or similar). Electronically Signed   By: Feliberto Harts M.D.   On: 10/19/2023 16:44    Procedures .Critical Care  Performed by: Peter Garter, PA Authorized by: Peter Garter, PA   Critical care provider statement:    Critical care time (minutes):  57   Critical care was  necessary to treat or prevent imminent or life-threatening deterioration of the following conditions:  Sepsis   Critical care was time spent personally by me on the following activities:  Development of treatment plan with patient or surrogate, discussions with consultants, evaluation of patient's response to treatment, examination of patient, ordering and review of laboratory studies, ordering and review of radiographic studies, ordering and performing treatments and interventions, pulse oximetry, re-evaluation of patient's condition and review of old charts   I assumed direction of critical care for this patient from another provider in my specialty: no     Care discussed with: admitting provider       Medications Ordered in ED Medications  acetaminophen (TYLENOL) tablet 650 mg (has no administration in time range)    Or  acetaminophen (TYLENOL) suppository 650 mg (has no administration in time range)  HYDROcodone-acetaminophen (NORCO/VICODIN) 5-325 MG per tablet 1-2 tablet (has no administration in time range)  ondansetron (ZOFRAN) tablet 4 mg (has no administration in time range)    Or  ondansetron (ZOFRAN) injection 4 mg (has no administration in time range)  cefTRIAXone (ROCEPHIN) 1 g in sodium chloride 0.9 % 100 mL IVPB (1 g Intravenous New Bag/Given 10/20/23 0055)  tamsulosin (FLOMAX) capsule 0.4 mg (has no administration in time range)  finasteride (PROSCAR) tablet 5 mg (has no administration in time range)  0.9 %  sodium chloride infusion (has no  administration in time range)  lactated ringers bolus 1,000 mL (0 mLs Intravenous Stopped 10/19/23 1852)  ceFEPIme (MAXIPIME) 2 g in sodium chloride 0.9 % 100 mL IVPB (0 g Intravenous Stopped 10/19/23 1717)  metroNIDAZOLE (FLAGYL) IVPB 500 mg (0 mg Intravenous Stopped 10/19/23 1852)  vancomycin (VANCOCIN) IVPB 1000 mg/200 mL premix (0 mg Intravenous Stopped 10/20/23 0805)  acetaminophen (TYLENOL) tablet 1,000 mg (1,000 mg Oral Given 10/19/23 1652)  potassium chloride SA (KLOR-CON M) CR tablet 40 mEq (40 mEq Oral Given 10/19/23 1900)  iohexol (OMNIPAQUE) 300 MG/ML solution 100 mL (85 mLs Intravenous Contrast Given 10/19/23 1935)    ED Course/ Medical Decision Making/ A&P Clinical Course as of 10/20/23 0954  Wed Oct 19, 2023  1558 Douglas Chavez [phone] [CR]  1831 Reevaluation the patient after IV fluid/medication administration showed improving mentation.  Patient able to ambulate to and from the bathroom without difficulty. [CR]  1844 Consulted hospitalist Dr. Adela Glimpse who agreed with admission and assume further treatment/care. [CR]    Clinical Course User Index [CR] Peter Garter, PA                                 Medical Decision Making Amount and/or Complexity of Data Reviewed Labs: ordered. Radiology: ordered.  Risk OTC drugs. Prescription drug management. Decision regarding hospitalization.   This patient presents to the ED for concern of altered mentation, this involves an extensive number of treatment options, and is a complaint that carries with it a high risk of complications and morbidity.  The differential diagnosis includes CVA, electrolyte derangement, medication side effect, infectious source, other   Co morbidities that complicate the patient evaluation  See HPI   Additional history obtained:  Additional history obtained from EMR External records from outside source obtained and reviewed including hospital records   Lab Tests:  I Ordered, and  personally interpreted labs.  The pertinent results include: Lucas ptosis of 13.3.  No evidence of anemia.  Thrombocytopenia 114.  Mild hypokalemia 3.4, decreased bicarb of 21.  Normal creatinine and GFR.  Mild elevation of AST of 52 but otherwise, ALT normal.  Blood culture and urine culture pending.  UA concerning for infection with rare bacteria, 21-50 WBCs, 11-20 RBCs and small leukocytes.  Lactic acid normal.   Imaging Studies ordered:  I ordered imaging studies including CT head/cervical spine, chest x-ray, CT abdomen pelvis I independently visualized and interpreted imaging which showed  CT head/cervical spine: No acute intracranial abnormality.  No acute fracture or traumatic listhesis of cervical spine.  2.1 cm parotid mass Chest x-ray: No acute abnormalities. CT abdomen pelvis: Pending I agree with the radiologist interpretation   Cardiac Monitoring: / EKG:  The patient was maintained on a cardiac monitor.  I personally viewed and interpreted the cardiac monitored which showed an underlying rhythm of: Tachycardia   Consultations Obtained:  See ED course  Problem List / ED Course / Critical interventions / Medication management  Urosepsis, altered mentation, parotid mass, elevated CK I ordered medication including cefepime, vancomycin, Flagyl, 1 L of lactated Ringer's, Tylenol  Reevaluation of the patient after these medicines showed that the patient improved I have reviewed the patients home medicines and have made adjustments as needed   Social Determinants of Health:  Chronic cigarette use.  Denies illicit drug use.   Test / Admission - Considered:  Urosepsis, altered mentation, parotid mass, elevated CK Vitals signs significant for fever with normal temp 100.7, tachycardia initially with a heart rate of 120.Marland Kitchen Otherwise within normal range and stable throughout visit. Laboratory/imaging studies significant for: See above 77 year old male presents emergency  department with altered mentation, generalized weakness for the past 1 to 2 days.  History is somewhat difficult to ascertain given patient's inability to recall specific details as well as incident regarding patient being found on "stuck in the bathtub" was unwitnessed.  Regardless, patient found by EMS in the bathtub of after unknown amount of time had elapsed with generalized weakness and inability to respond coherently.  Initially on presentation, patient found to be tachycardic with a heart rate in the 120s, febrile with a rectal temp of 100.7.  Subsequently, sepsis protocol was followed with broad-spectrum antibiotics given that no clear upfront infectious source was able to be delineated.  On exam, no appreciable traumatic injury or reproducible tenderness.  Patient reported that he "lowered himself" into the bathtub.  No obvious lung sounds concerning for pneumonia or other infiltration.  No abdominal tenderness.  Workup concerning for leukocytosis of 13.3, elevated CK of 3400 as well as UA concerning for infection.  Patient's neuroexam nonfocal with no lateralizing weakness.  Patient symptoms most likely secondary to urosepsis.  Will admit to hospitalist. Treatment plan were discussed at length with patient and he acknowledge understanding was agreeable to said plan.  Appropriate consultations were made as described in the ED course.  Patient was stable upon admission to the hospital.         Final Clinical Impression(s) / ED Diagnoses Final diagnoses:  Parotid mass  Altered mental status, unspecified altered mental status type  Elevated CK  Sepsis, due to unspecified organism, unspecified whether acute organ dysfunction present [facility])  Acute cystitis with hematuria    Rx / DC Orders ED Discharge Orders     None         Peter Garter, [place] 10/20/23 0954    Royanne Foots, DO 10/29/23 1033

## 2023-10-19 NOTE — ED Notes (Signed)
Blood cultures x2 sets were obtained before starting abx.

## 2023-10-19 NOTE — Subjective & Objective (Signed)
77 yo male found down for unclear time was able to ambulate 10 m After he was found down. Reported he felt weak all over for the past few days. Was unable to get out of the . Was up for the past 36 h bc he was helping his wife No localized neuro signs Code sepsis initiated CK 1700 UA? UTI Septic and started on broad spectrum Abx Of note has a new found parotid mass may need follow up with ENT Started on broad spectrum abx, feeling better Had similar presentaion in March with UTI

## 2023-10-19 NOTE — ED Triage Notes (Signed)
BIB [facility] from home, pt found in floor of bedroom by daughter. Reports pt was awake but non-responsive to commands and would not speak. Reports pt was able to stand after 10 mins of non-responsive episode and walk. Pt was CA&Ox4 upon EMS arrival and following commands. Pt remembers being in bathtub, feeling weak, and unable to get out. Has hx of previous similar episode.   EMS vitals HR 118, BP 108/68, 95% RA, CBG 107. Pt CA&Ox4 upon arrival.

## 2023-10-20 ENCOUNTER — Observation Stay (HOSPITAL_COMMUNITY): Payer: No Typology Code available for payment source

## 2023-10-20 DIAGNOSIS — R011 Cardiac murmur, unspecified: Secondary | ICD-10-CM

## 2023-10-20 DIAGNOSIS — R6520 Severe sepsis without septic shock: Secondary | ICD-10-CM

## 2023-10-20 DIAGNOSIS — N400 Enlarged prostate without lower urinary tract symptoms: Secondary | ICD-10-CM | POA: Diagnosis present

## 2023-10-20 DIAGNOSIS — K760 Fatty (change of) liver, not elsewhere classified: Secondary | ICD-10-CM | POA: Diagnosis present

## 2023-10-20 DIAGNOSIS — E871 Hypo-osmolality and hyponatremia: Secondary | ICD-10-CM | POA: Diagnosis present

## 2023-10-20 DIAGNOSIS — Z1611 Resistance to penicillins: Secondary | ICD-10-CM | POA: Diagnosis present

## 2023-10-20 DIAGNOSIS — W19XXXA Unspecified fall, initial encounter: Secondary | ICD-10-CM | POA: Diagnosis present

## 2023-10-20 DIAGNOSIS — G9340 Encephalopathy, unspecified: Secondary | ICD-10-CM | POA: Diagnosis not present

## 2023-10-20 DIAGNOSIS — N3001 Acute cystitis with hematuria: Secondary | ICD-10-CM | POA: Diagnosis present

## 2023-10-20 DIAGNOSIS — M6282 Rhabdomyolysis: Secondary | ICD-10-CM

## 2023-10-20 DIAGNOSIS — E860 Dehydration: Secondary | ICD-10-CM | POA: Diagnosis present

## 2023-10-20 DIAGNOSIS — A419 Sepsis, unspecified organism: Secondary | ICD-10-CM

## 2023-10-20 DIAGNOSIS — I10 Essential (primary) hypertension: Secondary | ICD-10-CM | POA: Diagnosis present

## 2023-10-20 DIAGNOSIS — K118 Other diseases of salivary glands: Secondary | ICD-10-CM

## 2023-10-20 DIAGNOSIS — D6959 Other secondary thrombocytopenia: Secondary | ICD-10-CM | POA: Diagnosis present

## 2023-10-20 DIAGNOSIS — F17210 Nicotine dependence, cigarettes, uncomplicated: Secondary | ICD-10-CM | POA: Diagnosis present

## 2023-10-20 DIAGNOSIS — E876 Hypokalemia: Secondary | ICD-10-CM | POA: Diagnosis present

## 2023-10-20 DIAGNOSIS — N3000 Acute cystitis without hematuria: Secondary | ICD-10-CM

## 2023-10-20 DIAGNOSIS — G9341 Metabolic encephalopathy: Secondary | ICD-10-CM | POA: Diagnosis present

## 2023-10-20 DIAGNOSIS — Z87442 Personal history of urinary calculi: Secondary | ICD-10-CM | POA: Diagnosis not present

## 2023-10-20 DIAGNOSIS — A4189 Other specified sepsis: Secondary | ICD-10-CM | POA: Diagnosis present

## 2023-10-20 DIAGNOSIS — B9689 Other specified bacterial agents as the cause of diseases classified elsewhere: Secondary | ICD-10-CM | POA: Diagnosis present

## 2023-10-20 DIAGNOSIS — Z79899 Other long term (current) drug therapy: Secondary | ICD-10-CM | POA: Diagnosis not present

## 2023-10-20 DIAGNOSIS — Z1152 Encounter for screening for COVID-19: Secondary | ICD-10-CM | POA: Diagnosis not present

## 2023-10-20 LAB — BASIC METABOLIC PANEL
Anion gap: 7 (ref 5–15)
BUN: 24 mg/dL — ABNORMAL HIGH (ref 8–23)
CO2: 21 mmol/L — ABNORMAL LOW (ref 22–32)
Calcium: 8.4 mg/dL — ABNORMAL LOW (ref 8.9–10.3)
Chloride: 109 mmol/L (ref 98–111)
Creatinine, Ser: 0.89 mg/dL (ref 0.61–1.24)
GFR, Estimated: >60 mL/min (ref >60)
Glucose, Bld: 129 mg/dL — ABNORMAL HIGH (ref 70–99)
Potassium: 4 mmol/L (ref 3.5–5.1)
Sodium: 137 mmol/L (ref 135–145)

## 2023-10-20 LAB — COMPREHENSIVE METABOLIC PANEL
ALT: 26 U/L (ref 0–44)
AST: 100 U/L — ABNORMAL HIGH (ref 15–41)
Albumin: 3 g/dL — ABNORMAL LOW (ref 3.5–5.0)
Alkaline Phosphatase: 51 U/L (ref 38–126)
Anion gap: 5 (ref 5–15)
BUN: 30 mg/dL — ABNORMAL HIGH (ref 8–23)
CO2: 22 mmol/L (ref 22–32)
Calcium: 8 mg/dL — ABNORMAL LOW (ref 8.9–10.3)
Chloride: 106 mmol/L (ref 98–111)
Creatinine, Ser: 0.88 mg/dL (ref 0.61–1.24)
GFR, Estimated: >60 mL/min (ref >60)
Glucose, Bld: 108 mg/dL — ABNORMAL HIGH (ref 70–99)
Potassium: 3.9 mmol/L (ref 3.5–5.1)
Sodium: 133 mmol/L — ABNORMAL LOW (ref 135–145)
Total Bilirubin: 0.9 mg/dL (ref <1.2)
Total Protein: 5.6 g/dL — ABNORMAL LOW (ref 6.5–8.1)

## 2023-10-20 LAB — MAGNESIUM: Magnesium: 2.2 mg/dL (ref 1.7–2.4)

## 2023-10-20 LAB — BLOOD GAS, VENOUS
Acid-Base Excess: 1.5 mmol/L (ref 0.0–2.0)
Bicarbonate: 25.9 mmol/L (ref 20.0–28.0)
Drawn by: 6807
O2 Saturation: 83.8 %
Patient temperature: 37.1
pCO2, Ven: 39 mm[Hg] — ABNORMAL LOW (ref 44–60)
pH, Ven: 7.43 (ref 7.25–7.43)
pO2, Ven: 49 mm[Hg] — ABNORMAL HIGH (ref 32–45)

## 2023-10-20 LAB — ECHOCARDIOGRAM COMPLETE
Area-P 1/2: 3.56 cm2
Calc EF: 56.5 %
Height: 68 in
S' Lateral: 3.3 cm
Single Plane A2C EF: 54.8 %
Single Plane A4C EF: 62.4 %
Weight: 2560 [oz_av]

## 2023-10-20 LAB — CK
Total CK: 5827 U/L — ABNORMAL HIGH (ref 49–397)
Total CK: 4713 U/L — ABNORMAL HIGH (ref 49–397)

## 2023-10-20 LAB — CBC
HCT: 38.4 % — ABNORMAL LOW (ref 39.0–52.0)
Hemoglobin: 12.6 g/dL — ABNORMAL LOW (ref 13.0–17.0)
MCH: 32.2 pg (ref 26.0–34.0)
MCHC: 32.8 g/dL (ref 30.0–36.0)
MCV: 98.2 fL (ref 80.0–100.0)
Platelets: 99 10*3/uL — ABNORMAL LOW (ref 150–400)
RBC: 3.91 MIL/uL — ABNORMAL LOW (ref 4.22–5.81)
RDW: 13 % (ref 11.5–15.5)
WBC: 11.3 10*3/uL — ABNORMAL HIGH (ref 4.0–10.5)
nRBC: 0 % (ref 0.0–0.2)

## 2023-10-20 LAB — PHOSPHORUS
Phosphorus: 3.2 mg/dL (ref 2.5–4.6)
Phosphorus: 2.7 mg/dL (ref 2.5–4.6)

## 2023-10-20 LAB — OSMOLALITY: Osmolality: 292 mosm/kg (ref 275–295)

## 2023-10-20 LAB — AMMONIA: Ammonia: 25 umol/L (ref 9–35)

## 2023-10-20 LAB — TSH: TSH: 1.213 u[IU]/mL (ref 0.350–4.500)

## 2023-10-20 LAB — TROPONIN I (HIGH SENSITIVITY): Troponin I (High Sensitivity): 49 ng/L — ABNORMAL HIGH (ref <18)

## 2023-10-20 LAB — PROCALCITONIN: Procalcitonin: 16 ng/mL

## 2023-10-20 MED ORDER — FINASTERIDE 5 MG PO TABS
5.0000 mg | ORAL_TABLET | Freq: Every day | ORAL | Status: DC
  Administered 2023-10-20 – 2023-10-22 (×3): 5 mg via ORAL
  Filled 2023-10-20 (×3): qty 1

## 2023-10-20 MED ORDER — SODIUM CHLORIDE 0.9 % IV SOLN: INTRAVENOUS | Status: DC

## 2023-10-20 MED ORDER — TAMSULOSIN HCL 0.4 MG PO CAPS
0.4000 mg | ORAL_CAPSULE | Freq: Every day | ORAL | Status: DC
  Administered 2023-10-20 – 2023-10-21 (×2): 0.4 mg via ORAL
  Filled 2023-10-20 (×2): qty 1

## 2023-10-20 MED ORDER — SODIUM CHLORIDE 0.9 % IV SOLN
1.0000 g | INTRAVENOUS | Status: DC
  Administered 2023-10-20 – 2023-10-22 (×3): 1 g via INTRAVENOUS
  Filled 2023-10-20 (×3): qty 10

## 2023-10-20 NOTE — Progress Notes (Signed)
  Echocardiogram 2D Echocardiogram has been performed.  Janalyn Harder 10/20/2023, 11:07 AM

## 2023-10-20 NOTE — Progress Notes (Signed)
PROGRESS NOTE    Douglas Chavez  VWU:[id number] DOB: October 04, 1946 DOA: 10/19/2023 PCP: [facility]    Brief Narrative:  Douglas Chavez is a 77 y.o. male with medical history significant of HTN, BPH, presented to hospital after being found down on the floor for unclear duration.  Patient had been feeling generalized weakness for few days prior to this presentation.  He did have similar presentation in March with UTI.  Patient has not been eating and drinking well recently since his was spending his time with his wife in the hospital.  In the ED patient was noted to be febrile, tachycardic initially.  CK level was elevated at 3413.  Potassium was low at 3.4.  WBC elevated at 13.3.  Platelet low at 114.  Lactate was 1.4.  COVID influenza and RSV was negative.  Urinalysis showed leukocyte small.  EKG showed sinus tachycardia.  Chest x-ray did not show any infiltrate.  CT scan of the abdomen pelvis without any acute findings.  CT head showed parotid mass.  CT cervical spine with degenerative changes.  Code sepsis was initiated and was started on broad-spectrum antibiotic.  In the ED patient received Ringer lactate bolus followed by infusion, cefepime Metro and vancomycin for possible sepsis, potassium p.o. and was considered for admission to the hospital for further evaluation and treatment.   Assessment and plan.  Severe sepsis likely secondary to acute cystitis with hematuria,  Patient had leukocytosis mild grade fever acute encephalopathy presentation.  On Flomax and Proscar.  Continue antibiotic with IV Rocephin..  Follow blood cultures and urine cultures.  Patient received IV hydration.  Pending.  Procalcitonin was elevated at 16  History of BPH.   Continue finasteride and tamsulosin.  Fall, possible syncope. CT head was nonacute but did show parotid mass.  Will benefit from PT OT evaluation.  Check 2D echocardiogram.  Troponin borderline elevated at 49 but no EKG changes or chest  pain.  Essential hypertension.  On lisinopril at home.  Creatinine within normal range.  Will hold off with lisinopril for now.  Mild thrombocytopenia.  Likely secondary to infection.  Will continue to monitor.  Incidental parotid mass.  Will need follow-up with ENT as outpatient.  Altered mental status likely metabolic encephalopathy secondary to UTI, volume depletion.  Ammonia was 25 and within normal range.  TSH of 1.2, venous blood gas with pCO2 of 39.  Encephalopathy has resolved at this time.  Mild rhabdomyolysis secondary to fall. CK level trended up to 4713 today.  Continue with IV hydration.  Mild hyponatremia.  Serum osmolality within normal range.  Will continue to monitor.  Tobacco abuse.  Counseling done.  Mild hypokalemia on presentation.  Has improved with replacement.  Latest potassium of 3.9.    DVT prophylaxis: SCDs Start: 10/19/23 2153   Code Status:     Code Status: Full Code  Disposition: Home will get PT OT evaluation.  Status is: Observation  The patient will require care spanning > 2 midnights and should be moved to inpatient because: Pending clinical improvement, severe sepsis, rhabdomyolysis, IV fluids   Family Communication: Spoke with the patient's wife at bedside  Consultants:  None  Procedures:  None  Antimicrobials:  Rocephin IV  Anti-infectives (From admission, onward)    Start     Dose/Rate Route Frequency Ordered Stop   10/20/23 0115  cefTRIAXone (ROCEPHIN) 1 g in sodium chloride 0.9 % 100 mL IVPB        1 g 200 mL/hr  over 30 Minutes Intravenous Every 24 hours 10/20/23 0021     10/19/23 1645  ceFEPIme (MAXIPIME) 2 g in sodium chloride 0.9 % 100 mL IVPB        2 g 200 mL/hr over 30 Minutes Intravenous  Once 10/19/23 1633 10/19/23 1717   10/19/23 1645  metroNIDAZOLE (FLAGYL) IVPB 500 mg        500 mg 100 mL/hr over 60 Minutes Intravenous  Once 10/19/23 1633 10/19/23 1852   10/19/23 1645  vancomycin (VANCOCIN) IVPB 1000 mg/200 mL  premix        1,000 mg 200 mL/hr over 60 Minutes Intravenous  Once 10/19/23 1633 10/20/23 0805        Subjective: Today, patient was seen and examined at bedside.  Patient complains of being sore from lying down.  Denies any fever chills or rigor.  Has not ambulated.  No nausea vomiting.  Objective: Vitals:   10/19/23 2000 10/19/23 2108 10/20/23 0122 10/20/23 0559  BP: 106/73 119/84 121/76 137/83  Pulse: 76 70 69 63  Resp: 18 16 15 16   Temp:  97.7 F (36.5 C)  97.6 F (36.4 C)  TempSrc:  Oral  Oral  SpO2: 97% 98% 97% 98%  Weight:      Height:        Intake/Output Summary (Last 24 hours) at 10/20/2023 1011 Last data filed at 10/20/2023 0629 Gross per 24 hour  Intake 2157.6 ml  Output 1275 ml  Net 882.6 ml   Filed Weights   10/19/23 1538  Weight: 72.6 kg    Physical Examination: Body mass index is 24.33 kg/m.  General:  Average built, not in obvious distress HENT:   No scleral pallor or icterus noted. Oral mucosa is moist.  Chest:  Clear breath sounds.  Diminished breath sounds bilaterally. No crackles or wheezes.  CVS: S1 &S2 heard. No murmur.  Regular rate and rhythm. Abdomen: Soft, nontender, nondistended.  Bowel sounds are heard.   Extremities: No cyanosis, clubbing or edema.  Peripheral pulses are palpable. Psych: Alert, awake and oriented, normal mood CNS:  No cranial nerve deficits.  Power equal in all extremities.  Tenderness on palpation of the extremities. Skin: Warm and dry.  No rashes noted.  Data Reviewed:   CBC: Recent Labs  Lab 10/19/23 1606 10/20/23 0427  WBC 13.3* 11.3*  NEUTROABS 12.3*  --   HGB 14.5 12.6*  HCT 41.8 38.4*  MCV 92.1 98.2  PLT 114* 99*    Basic Metabolic Panel: Recent Labs  Lab 10/19/23 1606 10/20/23 0001 10/20/23 0427  NA 137 137 133*  K 3.4* 4.0 3.9  CL 105 109 106  CO2 21* 21* 22  GLUCOSE 103* 129* 108*  BUN 24* 24* 30*  CREATININE 0.84 0.89 0.88  CALCIUM 8.7* 8.4* 8.0*  MG 1.8  --  2.2  PHOS  --  3.2  2.7    Liver Function Tests: Recent Labs  Lab 10/19/23 1606 10/20/23 0427  AST 52* 100*  ALT 15 26  ALKPHOS 62 51  BILITOT 1.5* 0.9  PROT 6.3* 5.6*  ALBUMIN 4.0 3.0*     Radiology Studies: CT ABDOMEN PELVIS W CONTRAST  Result Date: 10/19/2023 CLINICAL DATA:  Acute nonlocalized abdominal pain EXAM: CT ABDOMEN AND PELVIS WITH CONTRAST TECHNIQUE: Multidetector CT imaging of the abdomen and pelvis was performed using the standard protocol following bolus administration of intravenous contrast. RADIATION DOSE REDUCTION: This exam was performed according to the departmental dose-optimization program which includes automated exposure control, adjustment  of the mA and/or kV according to patient size and/or use of iterative reconstruction technique. CONTRAST:  85mL OMNIPAQUE IOHEXOL 300 MG/ML  SOLN COMPARISON:  None Available. FINDINGS: Lower chest: No acute abnormality. Hepatobiliary: Hepatic steatosis. Large gallstones in the gallbladder without evidence of acute cholecystitis. No biliary dilation. Pancreas: Unremarkable. Spleen: Unremarkable. Adrenals/Urinary Tract: Normal adrenal glands. No urinary calculi or hydronephrosis. Right posterior bladder diverticulum. Stomach/Bowel: Normal caliber large and small bowel. No bowel wall thickening. The appendix is normal.Stomach is within normal limits. Vascular/Lymphatic: No significant vascular findings are present. No enlarged abdominal or pelvic lymph nodes. Reproductive: Enlarged prostate Other: No free intraperitoneal fluid or air. Musculoskeletal: No acute fracture. IMPRESSION: 1. No acute abnormality in the abdomen or pelvis. 2. Hepatic steatosis. 3. Cholelithiasis without evidence of acute cholecystitis. 4. Enlarged prostate. Electronically Signed   By: Minerva Fester M.D.   On: 10/19/2023 20:11   DG Chest 2 View  Result Date: 10/19/2023 CLINICAL DATA:  Found on floor in bathroom today.  Nonresponsive. EXAM: CHEST - 2 VIEW COMPARISON:   05/11/2017 FINDINGS: The heart size is normal. Stable ectasia of thoracic aorta. Both lungs are clear. The visualized skeletal structures are unremarkable. IMPRESSION: No active cardiopulmonary disease. Electronically Signed   By: Danae Orleans M.D.   On: 10/19/2023 16:50   CT Head Wo Contrast  Result Date: 10/19/2023 CLINICAL DATA:  Mental status change, unknown cause; Neck trauma (Age >= 65y) EXAM: CT HEAD WITHOUT CONTRAST CT CERVICAL SPINE WITHOUT CONTRAST TECHNIQUE: Multidetector CT imaging of the head and cervical spine was performed following the standard protocol without intravenous contrast. Multiplanar CT image reconstructions of the cervical spine were also generated. RADIATION DOSE REDUCTION: This exam was performed according to the departmental dose-optimization program which includes automated exposure control, adjustment of the mA and/or kV according to patient size and/or use of iterative reconstruction technique. COMPARISON:  None Available. FINDINGS: CT HEAD FINDINGS Brain: No evidence of acute infarction, hemorrhage, hydrocephalus, extra-axial collection or mass lesion/mass effect. Vascular: No hyperdense vessel or unexpected calcification. Skull: No acute fracture. Sinuses/Orbits: Right maxillary sinus mucosal thickening. No acute orbital findings. Other: Right parotid mass is seen on the CT of the cervical spine and described below. CT CERVICAL SPINE FINDINGS Alignment: No substantial sagittal subluxation. Skull base and vertebrae: No acute fracture. Vertebral body heights are maintained. Soft tissues and spinal canal: No prevertebral fluid or swelling. No visible canal hematoma. Disc levels:  Moderate multilevel degenerative change. Upper chest: Visualized lung apices are clear. Other: Approximately 2.1 cm right parotid mass. Superficial 2.7 cm low-attenuation lesion in the right lower neck. IMPRESSION: 1. No evidence of acute traumatic abnormality intracranially or in the cervical spine 2.  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 Recommend ENT consultation for management. 3. Superficial 2.7 cm low-attenuation lesion in the right lower neck, most likely of dermal origin (such as a sebaceous cyst or similar). Electronically Signed   By: Feliberto Harts M.D.   On: 10/19/2023 16:44   CT Cervical Spine Wo Contrast  Result Date: 10/19/2023 CLINICAL DATA:  Mental status change, unknown cause; Neck trauma (Age >= 65y) EXAM: CT HEAD WITHOUT CONTRAST CT CERVICAL SPINE WITHOUT CONTRAST TECHNIQUE: Multidetector CT imaging of the head and cervical spine was performed following the standard protocol without intravenous contrast. Multiplanar CT image reconstructions of the cervical spine were also generated. RADIATION DOSE REDUCTION: This exam was performed according to the departmental dose-optimization program which includes automated exposure control, adjustment of the mA  and/or kV according to patient size and/or use of iterative reconstruction technique. COMPARISON:  None Available. FINDINGS: CT HEAD FINDINGS Brain: No evidence of acute infarction, hemorrhage, hydrocephalus, extra-axial collection or mass lesion/mass effect. Vascular: No hyperdense vessel or unexpected calcification. Skull: No acute fracture. Sinuses/Orbits: Right maxillary sinus mucosal thickening. No acute orbital findings. Other: Right parotid mass is seen on the CT of the cervical spine and described below. CT CERVICAL SPINE FINDINGS Alignment: No substantial sagittal subluxation. Skull base and vertebrae: No acute fracture. Vertebral body heights are maintained. Soft tissues and spinal canal: No prevertebral fluid or swelling. No visible canal hematoma. Disc levels:  Moderate multilevel degenerative change. Upper chest: Visualized lung apices are clear. Other: Approximately 2.1 cm right parotid mass. Superficial 2.7 cm low-attenuation lesion in the right lower neck. IMPRESSION: 1. No  evidence of acute traumatic abnormality intracranially or in the cervical spine 2. 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 Recommend ENT consultation for management. 3. Superficial 2.7 cm low-attenuation lesion in the right lower neck, most likely of dermal origin (such as a sebaceous cyst or similar). Electronically Signed   By: Feliberto Harts M.D.   On: 10/19/2023 16:44      LOS: 0 days    Joycelyn Das, MD [facility] Available via Epic secure chat 7am-7pm After these hours, please refer to coverage provider listed on [email or web address] 10/20/2023, 10:11 AM

## 2023-10-20 NOTE — Hospital Course (Signed)
Douglas Chavez is a 77 y.o. male with medical history significant of HTN, BPH, presented to hospital after being found down on the floor for unclear duration.  Patient had been feeling generalized weakness for few days prior to this presentation.  He did have similar presentation in March with UTI.  Patient has not been eating and drinking well recently since his was spending his time with his wife in the hospital.  In the ED patient was noted to be febrile, tachycardic initially.  CK level was elevated at 3413.  Potassium was low at 3.4.  WBC elevated at 13.3.  Platelet low at 114.  Lactate was 1.4.  COVID influenza and RSV was negative.  Urinalysis showed leukocyte small.  EKG showed sinus tachycardia.  Chest x-ray did not show any infiltrate.  CT scan of the abdomen pelvis without any acute findings.  CT head showed parotid mass.  CT cervical spine with degenerative changes.  Code sepsis was initiated and was started on broad-spectrum antibiotic.  In the ED patient received Ringer lactate bolus followed by infusion, cefepime Metro and vancomycin for possible sepsis, potassium p.o. and was considered for admission to the hospital for further evaluation and treatment.   Assessment and plan.  Severe sepsis likely secondary to acute cystitis with hematuria,  Patient had leukocytosis mild grade fever acute encephalopathy presentation.  On Flomax and Proscar.  Continue antibiotic with IV Rocephin..  Follow blood cultures and urine cultures.  Patient received IV hydration.  Pending.  Procalcitonin was elevated at 16  History of BPH.   Continue finasteride and tamsulosin.  Fall, possible syncope. CT head was nonacute but did show parotid mass.  Will benefit from PT OT evaluation.  Check 2D echocardiogram.  Troponin borderline elevated at 49 but no EKG changes or chest pain.  Essential hypertension.  On lisinopril at home.  Creatinine within normal range.  Will hold off with lisinopril for now.  Mild  thrombocytopenia.  Secondary to infection.  Will continue to monitor.  Incidental parotid mass.  Will need follow-up with ENT as outpatient.  Altered mental status likely metabolic encephalopathy secondary to UTI, volume depletion.  Ammonia was 25 and within normal range.  TSH of 1.2, venous blood gas with pCO2 of 39  Mild rhabdomyolysis secondary to fall. CK level trended up to 4713 today.  Continue with IV hydration.  Mild hyponatremia.  Serum osmolality within normal range.  Will continue to monitor.  Tobacco abuse.  Counseling done.  Mild hypokalemia on presentation.  Has improved with replacement.  Latest potassium of 3.9.

## 2023-10-20 NOTE — Plan of Care (Signed)
Problem: Education: Goal: Knowledge of General Education information will improve Description Including pain rating scale, medication(s)/side effects and non-pharmacologic comfort measures Outcome: Progressing   Problem: Clinical Measurements: Goal: Will remain free from infection Outcome: Progressing Goal: Diagnostic test results will improve Outcome: Progressing Goal: Cardiovascular complication will be avoided Outcome: Progressing   Problem: Activity: Goal: Risk for activity intolerance will decrease Outcome: Progressing   Problem: Coping: Goal: Level of anxiety will decrease Outcome: Progressing

## 2023-10-20 NOTE — Plan of Care (Signed)
Problem: Education: Goal: Knowledge of General Education information will improve Description: Including pain rating scale, medication(s)/side effects and non-pharmacologic comfort measures Outcome: Progressing   Problem: Coping: Goal: Level of anxiety will decrease Outcome: Progressing   Problem: Safety: Goal: Ability to remain free from injury will improve Outcome: Progressing

## 2023-10-20 NOTE — Evaluation (Signed)
Occupational Therapy Evaluation Patient Details Name: Douglas Chavez MRN: [id number] DOB: [birth year] Today's Date: 10/20/2023   History of Present Illness 77 y.o. male admitted 10/19/23 with UTI, found down, rhabdomyolysis, acute encephalopathy. Pt with medical history significant of HTN, BPH, heart murmur   Clinical Impression   PTA patient independent and driving. Admitted for above and presents with problem list below.  Pt with mild unsteadiness, decreased activity tolerance during session but overall able to manage ADLs and functional mobility with supervision due to IV pole mgmt; basic transfers with modified independence.  Based on performance today, recommend follow up acutely to ensure safety, independence and reduce risk of falls but anticipate no further needs after dc home.       If plan is discharge home, recommend the following: A little help with walking and/or transfers;A little help with bathing/dressing/bathroom    Functional Status Assessment  Patient has had a recent decline in their functional status and demonstrates the ability to make significant improvements in function in a reasonable and predictable amount of time.  Equipment Recommendations  None recommended by OT    Recommendations for Other Services       Precautions / Restrictions Precautions Precautions: Fall Restrictions Weight Bearing Restrictions: No      Mobility Bed Mobility Overal bed mobility: Modified Independent                  Transfers Overall transfer level: Modified independent                        Balance Overall balance assessment: Mild deficits observed, not formally tested                                         ADL either performed or assessed with clinical judgement   ADL Overall ADL's : Needs assistance/impaired     Grooming: Supervision/safety;Standing               Lower Body Dressing: Supervision/safety;Sit to/from  stand   Toilet Transfer: Supervision/safety;Ambulation   Toileting- Clothing Manipulation and Hygiene: Supervision/safety;Sit to/from stand       Functional mobility during ADLs: Supervision/safety General ADL Comments: IV pole mgmt, intermittent cueing for safety     Vision   Vision Assessment?: No apparent visual deficits     Perception         Praxis         Pertinent Vitals/Pain Pain Assessment Pain Assessment: No/denies pain     Extremity/Trunk Assessment Upper Extremity Assessment Upper Extremity Assessment: Overall WFL for tasks assessed   Lower Extremity Assessment Lower Extremity Assessment: Defer to PT evaluation       Communication Communication Communication: No apparent difficulties   Cognition Arousal: Alert Behavior During Therapy: WFL for tasks assessed/performed Overall Cognitive Status: Within Functional Limits for tasks assessed                                       General Comments  supervision for safety    Exercises     Shoulder Instructions      Home Living Family/patient expects to be discharged to:: Private residence Living Arrangements: Spouse/significant other;Children (daughter) Available Help at Discharge: Family;Available 24 hours/day Type of Home: House Home Access: Stairs to enter [employer] of Steps:  1 Entrance Stairs-Rails: None Home Layout: One level     Bathroom Shower/Tub: Chief Strategy Officer: Standard     Home Equipment: Agricultural consultant (2 wheels);BSC/3in1;Shower seat   Additional Comments: spouse just left the hospital      Prior Functioning/Environment Prior Level of Function : Independent/Modified Independent;Driving               ADLs Comments: independent ADLs, IADLs (yardwork)        OT Problem List: Decreased activity tolerance;Impaired balance (sitting and/or standing);Decreased safety awareness      OT Treatment/Interventions: Self-care/ADL  training;Therapeutic exercise;DME and/or AE instruction;Therapeutic activities;Patient/family education;Balance training    OT Goals(Current goals can be found in the care plan section) Acute Rehab OT Goals Patient Stated Goal: home OT Goal Formulation: With patient Time For Goal Achievement: 11/03/23 Potential to Achieve Goals: Good  OT Frequency: Min 1X/week    Co-evaluation              AM-PAC OT "6 Clicks" Daily Activity     Outcome Measure Help from another person eating meals?: None Help from another person taking care of personal grooming?: A Little Help from another person toileting, which includes using toliet, bedpan, or urinal?: A Little Help from another person bathing (including washing, rinsing, drying)?: A Little Help from another person to put on and taking off regular upper body clothing?: A Little Help from another person to put on and taking off regular lower body clothing?: A Little 6 Click Score: 19   End of Session Nurse Communication: Mobility status  Activity Tolerance: Patient tolerated treatment well Patient left: with call bell/phone within reach;with bed alarm set;Other (comment) (EOB)  OT Visit Diagnosis: Other abnormalities of gait and mobility (R26.89);Muscle weakness (generalized) (M62.81)                Time: 6213-0865 OT Time Calculation (min): 21 min Charges:  OT General Charges $OT Visit: 1 Visit OT Evaluation $OT Eval Moderate Complexity: 1 Mod  Barry Brunner, OT Acute Rehabilitation Services Office [phone]   Chancy Milroy 10/20/2023, 10:06 AM

## 2023-10-20 NOTE — Evaluation (Signed)
Physical Therapy Evaluation Patient Details Name: Douglas Chavez MRN: [id number] DOB: [birth year] Today's Date: 10/20/2023  History of Present Illness  77 y.o. male admitted 10/19/23 with UTI, found down, rhabdomyolysis, acute encephalopathy. Pt with medical history significant of HTN, BPH, heart murmur  Clinical Impression  Pt is mobilizing well at an independent level, he ambulated 180' without an assistive device, no loss of balance, SpO2 97-98% on room air walking, no dyspnea. He is ready to DC home from a PT standpoint. No further PT indicated, will sign off.          If plan is discharge home, recommend the following:     Can travel by private vehicle        Equipment Recommendations None recommended by PT  Recommendations for Other Services       Functional Status Assessment Patient has not had a recent decline in their functional status     Precautions / Restrictions Precautions Precautions: None;Fall Precaution Comments: fall just prior to admission; denies other falls in past 6 months Restrictions Weight Bearing Restrictions: No      Mobility  Bed Mobility Overal bed mobility: Independent                  Transfers Overall transfer level: Independent                      Ambulation/Gait Ambulation/Gait assistance: Independent Gait Distance (Feet): 180 Feet Assistive device: None Gait Pattern/deviations: WFL(Within Functional Limits) Gait velocity: WFL     General Gait Details: SpO2 97-98% on room air walking, no loss of balance  Stairs            Wheelchair Mobility     Tilt Bed    Modified Rankin (Stroke Patients Only)       Balance Overall balance assessment: Independent                                           Pertinent Vitals/Pain Pain Assessment Pain Assessment: 0-10    Home Living Family/patient expects to be discharged to:: Private residence Living Arrangements: Spouse/significant  other;Children (daughter) Available Help at Discharge: Family;Available 24 hours/day Type of Home: House Home Access: Stairs to enter Entrance Stairs-Rails: None Entrance Stairs-Number of Steps: 1   Home Layout: One level Home Equipment: Agricultural consultant (2 wheels);BSC/3in1;Shower seat Additional Comments: spouse just left the hospital    Prior Function Prior Level of Function : Independent/Modified Independent;Driving             Mobility Comments: walks without AD, does yardwork, fell just prior to admission, denies other falls in past 6 months ADLs Comments: independent ADLs, IADLs (yardwork)     Extremity/Trunk Assessment   Upper Extremity Assessment Upper Extremity Assessment: Overall WFL for tasks assessed    Lower Extremity Assessment Lower Extremity Assessment: Overall WFL for tasks assessed    Cervical / Trunk Assessment Cervical / Trunk Assessment: Normal  Communication   Communication Communication: No apparent difficulties  Cognition Arousal: Alert Behavior During Therapy: WFL for tasks assessed/performed Overall Cognitive Status: Within Functional Limits for tasks assessed                                          General Comments General comments (skin integrity,  edema, etc.): supervision for safety    Exercises     Assessment/Plan    PT Assessment Patient does not need any further PT services  PT Problem List         PT Treatment Interventions      PT Goals (Current goals can be found in the Care Plan section)  Acute Rehab PT Goals PT Goal Formulation: All assessment and education complete, DC therapy    Frequency       Co-evaluation               AM-PAC PT "6 Clicks" Mobility  Outcome Measure Help needed turning from your back to your side while in a flat bed without using bedrails?: None Help needed moving from lying on your back to sitting on the side of a flat bed without using bedrails?: None Help needed  moving to and from a bed to a chair (including a wheelchair)?: None Help needed standing up from a chair using your arms (e.g., wheelchair or bedside chair)?: None Help needed to walk in hospital room?: None Help needed climbing 3-5 steps with a railing? : None 6 Click Score: 24    End of Session Equipment Utilized During Treatment: Gait belt Activity Tolerance: Patient tolerated treatment well Patient left: in bed;with call bell/phone within reach Nurse Communication: Mobility status      Time: 0623-7628 PT Time Calculation (min) (ACUTE ONLY): 11 min   Charges:   PT Evaluation $PT Eval Low Complexity: 1 Low   PT General Charges $$ ACUTE PT VISIT: 1 Visit         Tamala Ser PT 10/20/2023  Acute Rehabilitation Services  Office [phone]

## 2023-10-21 DIAGNOSIS — A419 Sepsis, unspecified organism: Secondary | ICD-10-CM | POA: Diagnosis not present

## 2023-10-21 DIAGNOSIS — R6520 Severe sepsis without septic shock: Secondary | ICD-10-CM | POA: Diagnosis not present

## 2023-10-21 LAB — BASIC METABOLIC PANEL
Anion gap: 7 (ref 5–15)
BUN: 20 mg/dL (ref 8–23)
CO2: 21 mmol/L — ABNORMAL LOW (ref 22–32)
Calcium: 7.9 mg/dL — ABNORMAL LOW (ref 8.9–10.3)
Chloride: 110 mmol/L (ref 98–111)
Creatinine, Ser: 0.67 mg/dL (ref 0.61–1.24)
GFR, Estimated: >60 mL/min (ref >60)
Glucose, Bld: 110 mg/dL — ABNORMAL HIGH (ref 70–99)
Potassium: 3.5 mmol/L (ref 3.5–5.1)
Sodium: 138 mmol/L (ref 135–145)

## 2023-10-21 LAB — CBC
HCT: 37.8 % — ABNORMAL LOW (ref 39.0–52.0)
Hemoglobin: 12.6 g/dL — ABNORMAL LOW (ref 13.0–17.0)
MCH: 32.1 pg (ref 26.0–34.0)
MCHC: 33.3 g/dL (ref 30.0–36.0)
MCV: 96.2 fL (ref 80.0–100.0)
Platelets: 98 10*3/uL — ABNORMAL LOW (ref 150–400)
RBC: 3.93 MIL/uL — ABNORMAL LOW (ref 4.22–5.81)
RDW: 12.9 % (ref 11.5–15.5)
WBC: 6.4 10*3/uL (ref 4.0–10.5)
nRBC: 0 % (ref 0.0–0.2)

## 2023-10-21 LAB — CK: Total CK: 2312 U/L — ABNORMAL HIGH (ref 49–397)

## 2023-10-21 LAB — MAGNESIUM: Magnesium: 2 mg/dL (ref 1.7–2.4)

## 2023-10-21 MED ORDER — TRIAMCINOLONE ACETONIDE 0.5 % EX CREA: TOPICAL_CREAM | Freq: Two times a day (BID) | CUTANEOUS | Status: DC | PRN

## 2023-10-21 MED ORDER — POTASSIUM CHLORIDE CRYS ER 20 MEQ PO TBCR
20.0000 meq | EXTENDED_RELEASE_TABLET | Freq: Once | ORAL | Status: AC
  Administered 2023-10-21: 20 meq via ORAL
  Filled 2023-10-21: qty 1

## 2023-10-21 MED ORDER — LISINOPRIL 10 MG PO TABS
10.0000 mg | ORAL_TABLET | Freq: Every day | ORAL | Status: DC
  Administered 2023-10-21 – 2023-10-22 (×2): 10 mg via ORAL
  Filled 2023-10-21 (×2): qty 1

## 2023-10-21 NOTE — Progress Notes (Signed)
Mobility Specialist - Progress Note   10/21/23 0959  Mobility  Activity Ambulated independently in hallway  Level of Assistance Independent  Assistive Device None  Distance Ambulated (ft) 300 ft  Range of Motion/Exercises Active  Activity Response Tolerated well  Mobility Referral Yes  $Mobility charge 1 Mobility  Mobility Specialist Start Time (ACUTE ONLY) 0950  Mobility Specialist Stop Time (ACUTE ONLY) 0959  Mobility Specialist Time Calculation (min) (ACUTE ONLY) 9 min   Pt was found in room and agreeable to ambulate. No complaints with session and at EOS returned to sit EOB with all needs met. Call bell in reach and wife in room.  Billey Chang Mobility Specialist

## 2023-10-21 NOTE — Progress Notes (Signed)
PROGRESS NOTE    Douglas Chavez  UJW:[id number] DOB: 1946/09/17 DOA: 10/19/2023 PCP: [facility]    Brief Narrative:  Douglas Chavez is a 77 y.o. male with medical history significant of HTN, BPH, presented to hospital after being found down on the floor for unclear duration.  Patient had been feeling generalized weakness for few days prior to this presentation.  He did have similar presentation in March with UTI.  Patient has not been eating and drinking well recently since his was spending his time with his wife in the hospital.  In the ED patient was noted to be febrile, tachycardic initially.  CK level was elevated at 3413.  Potassium was low at 3.4.  WBC elevated at 13.3.  Platelet low at 114.  Lactate was 1.4.  COVID influenza and RSV was negative.  Urinalysis showed leukocyte small.  EKG showed sinus tachycardia.  Chest x-ray did not show any infiltrate.  CT scan of the abdomen pelvis without any acute findings.  CT head showed parotid mass.  CT cervical spine with degenerative changes.  Code sepsis was initiated and was started on broad-spectrum antibiotic.  In the ED patient received Ringer lactate bolus followed by infusion, cefepime Metro and vancomycin for possible sepsis, potassium p.o. and was considered for admission to the hospital for further evaluation and treatment.   Assessment and plan.  Severe sepsis likely secondary to acute cystitis with hematuria,  Patient had leukocytosis mild grade fever acute encephalopathy presentation.  On Flomax and Proscar.  Continue antibiotic with IV Rocephin..   Procalcitonin was elevated at 16.Blood cultures negative so far.  Urine culture more than 100,000 gram-negative rods..  Will continue to follow.  History of BPH.   Continue finasteride and tamsulosin.  Fall, possible syncope. CT head was nonacute but did show parotid mass.  PT has seen the patient and recommend no therapy needs on discharge.  2D echocardiogram with LV  ejection fraction of 55 to 60%.   Troponin borderline elevated at 49 but no EKG changes or chest pain.  Essential hypertension.  On lisinopril at home.  Creatinine within normal range.  Will resume lisinopril from today.  Mild thrombocytopenia.  Likely secondary to infection.  Will continue to monitor.  Latest platelet count of 98.  Incidental parotid mass.  Will need follow-up with ENT as outpatient.  Altered mental status likely metabolic encephalopathy secondary to UTI, volume depletion.  Ammonia was 25 and within normal range.  TSH of 1.2, venous blood gas with pCO2 of 39.  Encephalopathy has resolved at this time.  Mild rhabdomyolysis secondary to fall. CK level trended down to 1312 from 4713 today received IV hydration.  Encouraged oral hydration at this time.  Mild hyponatremia.  Serum osmolality within normal range.  Sodium level has improved to 138.  Tobacco abuse.  Counseling done.  Mild hypokalemia on presentation.  Has improved with replacement.  Latest potassium of 3.5.    DVT prophylaxis: SCDs Start: 10/19/23 2153   Code Status:     Code Status: Full Code  Disposition: Home will get PT OT evaluation.  Status is: Observation  The patient will require care spanning > 2 midnights and should be moved to inpatient because: Pending clinical improvement, severe sepsis, rhabdomyolysis, IV fluids   Family Communication: Spoke with the patient's wife at bedside again today.  Consultants:  None  Procedures:  None  Antimicrobials:  Rocephin IV  Anti-infectives (From admission, onward)    Start  Dose/Rate Route Frequency Ordered Stop   10/20/23 0115  cefTRIAXone (ROCEPHIN) 1 g in sodium chloride 0.9 % 100 mL IVPB        1 g 200 mL/hr over 30 Minutes Intravenous Every 24 hours 10/20/23 0021     10/19/23 1645  ceFEPIme (MAXIPIME) 2 g in sodium chloride 0.9 % 100 mL IVPB        2 g 200 mL/hr over 30 Minutes Intravenous  Once 10/19/23 1633 10/19/23 1717   10/19/23  1645  metroNIDAZOLE (FLAGYL) IVPB 500 mg        500 mg 100 mL/hr over 60 Minutes Intravenous  Once 10/19/23 1633 10/19/23 1852   10/19/23 1645  vancomycin (VANCOCIN) IVPB 1000 mg/200 mL premix        1,000 mg 200 mL/hr over 60 Minutes Intravenous  Once 10/19/23 1633 10/20/23 0805        Subjective: Today, patient was seen and examined at bedside.  Patient denies any new complaints.  Sitting on the bedside.  Denies any fever chills nausea vomiting. Objective: Vitals:   10/20/23 2008 10/20/23 2230 10/21/23 0456 10/21/23 0459  BP:  (!) 167/101 (!) 161/96 (!) 161/96  Pulse: 74 67 70 72  Resp:   18 18  Temp: 98.3 F (36.8 C)  97.9 F (36.6 C) 97.9 F (36.6 C)  TempSrc: Oral  Oral Oral  SpO2: 97%  97% 97%  Weight:      Height:        Intake/Output Summary (Last 24 hours) at 10/21/2023 0958 Last data filed at 10/21/2023 0730 Gross per 24 hour  Intake 3126.86 ml  Output 1350 ml  Net 1776.86 ml   Filed Weights   10/19/23 1538  Weight: 72.6 kg    Physical Examination: Body mass index is 24.33 kg/m.   General:  Average built, not in obvious distress HENT:   No scleral pallor or icterus noted. Oral mucosa is moist.  Chest:  Clear breath sounds.  . No crackles or wheezes.  CVS: S1 &S2 heard. No murmur.  Regular rate and rhythm. Abdomen: Soft, nontender, nondistended.  Bowel sounds are heard.   Extremities: No cyanosis, clubbing or edema.  Peripheral pulses are palpable. Psych: Alert, awake and oriented, normal mood CNS:  No cranial nerve deficits.  Power equal in all extremities.   Skin: Warm and dry.  No rashes noted.  Data Reviewed:   CBC: Recent Labs  Lab 10/19/23 1606 10/20/23 0427 10/21/23 0418  WBC 13.3* 11.3* 6.4  NEUTROABS 12.3*  --   --   HGB 14.5 12.6* 12.6*  HCT 41.8 38.4* 37.8*  MCV 92.1 98.2 96.2  PLT 114* 99* 98*    Basic Metabolic Panel: Recent Labs  Lab 10/19/23 1606 10/20/23 0001 10/20/23 0427 10/21/23 0418  NA 137 137 133* 138  K  3.4* 4.0 3.9 3.5  CL 105 109 106 110  CO2 21* 21* 22 21*  GLUCOSE 103* 129* 108* 110*  BUN 24* 24* 30* 20  CREATININE 0.84 0.89 0.88 0.67  CALCIUM 8.7* 8.4* 8.0* 7.9*  MG 1.8  --  2.2 2.0  PHOS  --  3.2 2.7  --     Liver Function Tests: Recent Labs  Lab 10/19/23 1606 10/20/23 0427  AST 52* 100*  ALT 15 26  ALKPHOS 62 51  BILITOT 1.5* 0.9  PROT 6.3* 5.6*  ALBUMIN 4.0 3.0*     Radiology Studies: ECHOCARDIOGRAM COMPLETE  Result Date: 10/20/2023    ECHOCARDIOGRAM REPORT   Patient  Name:   Douglas Chavez Date of Exam: 10/20/2023 Medical Rec #:  [id number]       Height:       68.0 in Accession #:    [id number]      Weight:       160.0 lb Date of Birth:  1946-05-01       BSA:          1.859 m Patient Age:    20 years        BP:           137/83 mmHg Patient Gender: M               HR:           68 bpm. Exam Location:  Inpatient Procedure: 2D Echo, Cardiac Doppler and Color Doppler Indications:    R01.1 Murmur  History:        Patient has no prior history of Echocardiogram examinations.                 Signs/Symptoms:Bacteremia; Risk Factors:Hypertension and Current                 Smoker.  Sonographer:    Sheralyn Boatman RDCS Referring Phys: [id number] ANASTASSIA DOUTOVA  Sonographer Comments: Technically difficult study due to poor echo windows. IMPRESSIONS  1. Left ventricular ejection fraction, by estimation, is 55 to 60%. The left ventricle has normal function. Left ventricular endocardial border not optimally defined to evaluate regional wall motion. Left ventricular diastolic parameters are indeterminate.  2. Right ventricular systolic function is normal. The right ventricular size is normal.  3. The mitral valve is degenerative. Trivial mitral valve regurgitation. No evidence of mitral stenosis.  4. The aortic valve was not well visualized. Aortic valve regurgitation is not visualized. No aortic stenosis is present.  5. The inferior vena cava is normal in size with greater than 50% respiratory  variability, suggesting right atrial pressure of 3 mmHg. Comparison(s): No prior Echocardiogram. FINDINGS  Left Ventricle: Left ventricular ejection fraction, by estimation, is 55 to 60%. The left ventricle has normal function. Left ventricular endocardial border not optimally defined to evaluate regional wall motion. The left ventricular internal cavity size was normal in size. There is no left ventricular hypertrophy. Left ventricular diastolic parameters are indeterminate. Right Ventricle: The right ventricular size is normal. No increase in right ventricular wall thickness. Right ventricular systolic function is normal. Left Atrium: Left atrial size was normal in size. Right Atrium: Right atrial size was normal in size. Pericardium: There is no evidence of pericardial effusion. Mitral Valve: The mitral valve is degenerative in appearance. Trivial mitral valve regurgitation. No evidence of mitral valve stenosis. Tricuspid Valve: The tricuspid valve is not well visualized. Tricuspid valve regurgitation is trivial. No evidence of tricuspid stenosis. Aortic Valve: The aortic valve was not well visualized. Aortic valve regurgitation is not visualized. No aortic stenosis is present. Pulmonic Valve: The pulmonic valve was not well visualized. Pulmonic valve regurgitation is not visualized. No evidence of pulmonic stenosis. Aorta: The aortic root and ascending aorta are structurally normal, with no evidence of dilitation. Venous: The inferior vena cava is normal in size with greater than 50% respiratory variability, suggesting right atrial pressure of 3 mmHg. IAS/Shunts: The interatrial septum was not well visualized.  LEFT VENTRICLE PLAX 2D LVIDd:         4.80 cm     Diastology LVIDs:         3.30 cm  LV e' medial:    4.68 cm/s LV PW:         1.05 cm     LV E/e' medial:  17.8 LV IVS:        1.10 cm     LV e' lateral:   8.27 cm/s LVOT diam:     2.20 cm     LV E/e' lateral: 10.1 LV SV:         90 LV SV Index:   49  LVOT Area:     3.80 cm  LV Volumes (MOD) LV vol d, MOD A2C: 81.0 ml LV vol d, MOD A4C: 50.2 ml LV vol s, MOD A2C: 36.6 ml LV vol s, MOD A4C: 18.9 ml LV SV MOD A2C:     44.4 ml LV SV MOD A4C:     50.2 ml LV SV MOD BP:      36.3 ml RIGHT VENTRICLE             IVC RV S prime:     10.40 cm/s  IVC diam: 1.40 cm TAPSE (M-mode): 1.8 cm LEFT ATRIUM             Index        RIGHT ATRIUM           Index LA diam:        3.50 cm 1.88 cm/m   RA Area:     13.90 cm LA Vol (A2C):   27.6 ml 14.85 ml/m  RA Volume:   33.00 ml  17.75 ml/m LA Vol (A4C):   38.7 ml 20.82 ml/m LA Biplane Vol: 32.6 ml 17.54 ml/m  AORTIC VALVE LVOT Vmax:   117.00 cm/s LVOT Vmean:  76.400 cm/s LVOT VTI:    0.238 m  AORTA Ao Root diam: 3.65 cm Ao Asc diam:  3.60 cm MITRAL VALVE MV Area (PHT): 3.56 cm    SHUNTS MV Decel Time: 213 msec    Systemic VTI:  0.24 m MV E velocity: 83.20 cm/s  Systemic Diam: 2.20 cm MV A velocity: 93.40 cm/s MV E/A ratio:  0.89 Douglas Chavez Electronically signed by Winfield Rast Chavez Signature Date/Time: 10/20/2023/12:14:16 PM    Final    CT ABDOMEN PELVIS W CONTRAST  Result Date: 10/19/2023 CLINICAL DATA:  Acute nonlocalized abdominal pain EXAM: CT ABDOMEN AND PELVIS WITH CONTRAST TECHNIQUE: Multidetector CT imaging of the abdomen and pelvis was performed using the standard protocol following bolus administration of intravenous contrast. RADIATION DOSE REDUCTION: This exam was performed according to the departmental dose-optimization program which includes automated exposure control, adjustment of the mA and/or kV according to patient size and/or use of iterative reconstruction technique. CONTRAST:  85mL OMNIPAQUE IOHEXOL 300 MG/ML  SOLN COMPARISON:  None Available. FINDINGS: Lower chest: No acute abnormality. Hepatobiliary: Hepatic steatosis. Large gallstones in the gallbladder without evidence of acute cholecystitis. No biliary dilation. Pancreas: Unremarkable. Spleen: Unremarkable. Adrenals/Urinary  Tract: Normal adrenal glands. No urinary calculi or hydronephrosis. Right posterior bladder diverticulum. Stomach/Bowel: Normal caliber large and small bowel. No bowel wall thickening. The appendix is normal.Stomach is within normal limits. Vascular/Lymphatic: No significant vascular findings are present. No enlarged abdominal or pelvic lymph nodes. Reproductive: Enlarged prostate Other: No free intraperitoneal fluid or air. Musculoskeletal: No acute fracture. IMPRESSION: 1. No acute abnormality in the abdomen or pelvis. 2. Hepatic steatosis. 3. Cholelithiasis without evidence of acute cholecystitis. 4. Enlarged prostate. Electronically Signed   By: Minerva Fester M.D.   On: 10/19/2023 20:11   DG Chest  2 View  Result Date: 10/19/2023 CLINICAL DATA:  Found on floor in bathroom today.  Nonresponsive. EXAM: CHEST - 2 VIEW COMPARISON:  05/11/2017 FINDINGS: The heart size is normal. Stable ectasia of thoracic aorta. Both lungs are clear. The visualized skeletal structures are unremarkable. IMPRESSION: No active cardiopulmonary disease. Electronically Signed   By: Danae Orleans M.D.   On: 10/19/2023 16:50   CT Head Wo Contrast  Result Date: 10/19/2023 CLINICAL DATA:  Mental status change, unknown cause; Neck trauma (Age >= 65y) EXAM: CT HEAD WITHOUT CONTRAST CT CERVICAL SPINE WITHOUT CONTRAST TECHNIQUE: Multidetector CT imaging of the head and cervical spine was performed following the standard protocol without intravenous contrast. Multiplanar CT image reconstructions of the cervical spine were also generated. RADIATION DOSE REDUCTION: This exam was performed according to the departmental dose-optimization program which includes automated exposure control, adjustment of the mA and/or kV according to patient size and/or use of iterative reconstruction technique. COMPARISON:  None Available. FINDINGS: CT HEAD FINDINGS Brain: No evidence of acute infarction, hemorrhage, hydrocephalus, extra-axial collection or  mass lesion/mass effect. Vascular: No hyperdense vessel or unexpected calcification. Skull: No acute fracture. Sinuses/Orbits: Right maxillary sinus mucosal thickening. No acute orbital findings. Other: Right parotid mass is seen on the CT of the cervical spine and described below. CT CERVICAL SPINE FINDINGS Alignment: No substantial sagittal subluxation. Skull base and vertebrae: No acute fracture. Vertebral body heights are maintained. Soft tissues and spinal canal: No prevertebral fluid or swelling. No visible canal hematoma. Disc levels:  Moderate multilevel degenerative change. Upper chest: Visualized lung apices are clear. Other: Approximately 2.1 cm right parotid mass. Superficial 2.7 cm low-attenuation lesion in the right lower neck. IMPRESSION: 1. No evidence of acute traumatic abnormality intracranially or in the cervical spine 2. 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 Recommend ENT consultation for management. 3. Superficial 2.7 cm low-attenuation lesion in the right lower neck, most likely of dermal origin (such as a sebaceous cyst or similar). Electronically Signed   By: Feliberto Harts M.D.   On: 10/19/2023 16:44   CT Cervical Spine Wo Contrast  Result Date: 10/19/2023 CLINICAL DATA:  Mental status change, unknown cause; Neck trauma (Age >= 65y) EXAM: CT HEAD WITHOUT CONTRAST CT CERVICAL SPINE WITHOUT CONTRAST TECHNIQUE: Multidetector CT imaging of the head and cervical spine was performed following the standard protocol without intravenous contrast. Multiplanar CT image reconstructions of the cervical spine were also generated. RADIATION DOSE REDUCTION: This exam was performed according to the departmental dose-optimization program which includes automated exposure control, adjustment of the mA and/or kV according to patient size and/or use of iterative reconstruction technique. COMPARISON:  None Available. FINDINGS: CT HEAD FINDINGS Brain: No  evidence of acute infarction, hemorrhage, hydrocephalus, extra-axial collection or mass lesion/mass effect. Vascular: No hyperdense vessel or unexpected calcification. Skull: No acute fracture. Sinuses/Orbits: Right maxillary sinus mucosal thickening. No acute orbital findings. Other: Right parotid mass is seen on the CT of the cervical spine and described below. CT CERVICAL SPINE FINDINGS Alignment: No substantial sagittal subluxation. Skull base and vertebrae: No acute fracture. Vertebral body heights are maintained. Soft tissues and spinal canal: No prevertebral fluid or swelling. No visible canal hematoma. Disc levels:  Moderate multilevel degenerative change. Upper chest: Visualized lung apices are clear. Other: Approximately 2.1 cm right parotid mass. Superficial 2.7 cm low-attenuation lesion in the right lower neck. IMPRESSION: 1. No evidence of acute traumatic abnormality intracranially or in the cervical spine 2. Approximately 2.1 cm right  parotid mass, suspicious for a primary parotid neoplasm. Recommend ENT consultation for management. 3. Superficial 2.7 cm low-attenuation lesion in the right lower neck, most likely of dermal origin (such as a sebaceous cyst or similar). Electronically Signed   By: Feliberto Harts M.D.   On: 10/19/2023 16:44      LOS: 1 day    Joycelyn Das, MD [facility] Available via Epic secure chat 7am-7pm After these hours, please refer to coverage provider listed on [email or web address] 10/21/2023, 9:58 AM

## 2023-10-21 NOTE — Progress Notes (Signed)
   10/21/23 1340  TOC Brief Assessment  Insurance and Status Reviewed  Patient has primary care physician Yes  Home environment has been reviewed Single family home  Prior level of function: Independent  Prior/Current Home Services No current home services  Social Determinants of Health Reivew SDOH reviewed no interventions necessary  Readmission risk has been reviewed Yes  Transition of care needs no transition of care needs at this time

## 2023-10-21 NOTE — Progress Notes (Signed)
Nutrition Brief Note  RD consulted for nutritional assessment.   Patient and pt's wife in room. Pt looking in room for some food someone had brought him. Reports good appetite today. Per wife, she has ham and pumpkin pie for him to eat. He ate breakfast with no issue. Reports he didn't sleep or eat while his wife was in the hospital. Now he is eating normally, doesn't eat a lot of meats but a lot of beans.  No needs identified at this time. No recent weight loss reported or noted in weight records.  Wt Readings from Last 15 Encounters:  10/19/23 72.6 kg  04/19/15 90.1 kg    Body mass index is 24.33 kg/m. Patient meets criteria for normal based on current BMI.   Current diet order is heart healthy, patient is consuming approximately 100% of meals at this time. Labs and medications reviewed.   No nutrition interventions warranted at this time.  If nutrition issues arise, please reconsult RD.   Tilda Franco, MS, RD, LDN Inpatient Clinical Dietitian Contact information available via Amion

## 2023-10-22 DIAGNOSIS — G9340 Encephalopathy, unspecified: Secondary | ICD-10-CM | POA: Diagnosis not present

## 2023-10-22 DIAGNOSIS — K760 Fatty (change of) liver, not elsewhere classified: Secondary | ICD-10-CM | POA: Diagnosis not present

## 2023-10-22 DIAGNOSIS — E876 Hypokalemia: Secondary | ICD-10-CM | POA: Diagnosis not present

## 2023-10-22 DIAGNOSIS — I10 Essential (primary) hypertension: Secondary | ICD-10-CM

## 2023-10-22 DIAGNOSIS — A419 Sepsis, unspecified organism: Secondary | ICD-10-CM | POA: Diagnosis not present

## 2023-10-22 LAB — URINE CULTURE: Culture: >=100000 — AB

## 2023-10-22 MED ORDER — CIPROFLOXACIN HCL 500 MG PO TABS
500.0000 mg | ORAL_TABLET | Freq: Once | ORAL | Status: AC
  Administered 2023-10-22: 500 mg via ORAL
  Filled 2023-10-22: qty 1

## 2023-10-22 MED ORDER — CIPROFLOXACIN HCL 500 MG PO TABS: 500.0000 mg | ORAL_TABLET | Freq: Two times a day (BID) | ORAL | 0 refills | Status: AC

## 2023-10-22 NOTE — Progress Notes (Signed)
AVS reviewed w/ pt who verbalized an understanding - No there questions at this time. Pt dressed for D/C -PIV removed as noted - Pt home w/ wife - car in ED parking lot

## 2023-10-22 NOTE — Discharge Summary (Addendum)
Physician Discharge Summary  Douglas Chavez ZOX:[id number] DOB: 08/19/1946 DOA: 10/19/2023  [facility]  Admit date: 10/19/2023 Discharge date: 10/22/2023  Admitted From: Home  Discharge disposition: Home   Recommendations for Outpatient Follow-Up:   Follow up with your primary care provider in one week.  Check CBC, BMP, magnesium in the next visit Patient would benefit from ENT referral for his parotid mass.   Discharge Diagnosis:   Principal Problem:   Sepsis (HCC) Active Problems:   Primary hypertension   UTI (urinary tract infection)   Parotid mass   Acute encephalopathy   Hepatic steatosis   Rhabdomyolysis   Tobacco abuse   Hypokalemia    Discharge Condition: Improved.  Diet recommendation: Low sodium, heart healthy.    Wound care: None.  Code status: Full.   History of Present Illness:   Douglas Chavez is a 77 y.o. male with medical history significant of HTN, BPH, presented to hospital after being found down on the floor for unclear duration. Patient had been feeling generalized weakness for few days prior to this presentation. He did have similar presentation in March with UTI. Patient has not been eating and drinking well recently since his was spending his time with his wife in the hospital.   In the ED, patient was noted to be febrile, tachycardic initially. CK level was elevated at 3413. Potassium was low at 3.4. WBC elevated at 13.3. Platelet low at 114. Lactate was 1.4. COVID influenza and RSV was negative. Urinalysis showed leukocyte small. EKG showed sinus tachycardia. Chest x-ray did not show any infiltrate. CT scan of the abdomen pelvis without any acute findings. CT head showed parotid mass. CT cervical spine with degenerative changes. Code sepsis was initiated and was started on broad-spectrum antibiotic. In the ED patient received Ringer lactate bolus followed by infusion, cefepime Metro and vancomycin for possible sepsis,  potassium p.o. and was considered for admission to the hospital for further evaluation and treatment.    Hospital Course:   Following conditions were addressed during hospitalization as listed below,  Severe sepsissecondary to acute Morganella cystitis with hematuria,  Patient had leukocytosis mild grade fever acute encephalopathy presentation.  Patient received IV Rocephin during hospitalization.  At this time sensitivity data sensitive with ciprofloxacin.  Will continue 7 more days of Cipro on discharge.  On initial presentation procalcitonin was elevated at 16.Blood cultures negative so far.     History of BPH.   Continue finasteride and tamsulosin.   Fall, possible syncope. CT head was nonacute but did show parotid mass.  PT has seen the patient and recommend no therapy needs on discharge.  2D echocardiogram with LV ejection fraction of 55 to 60%.   Troponin borderline elevated at 49 but no EKG changes or chest pain.  Patient ambulated well with physical therapy and did not have any near syncopal episodes.     Essential hypertension.  On lisinopril at home.  Will continue on discharge  .Mild thrombocytopenia.  Likely secondary to infection..  Latest platelet count of 98.  Recommend outpatient monitoring of platelets.   Incidental right parotid mass.  Will need follow-up with ENT as outpatient.  Spoke with the patient about it.  States that it has been there for 1 year now.   Altered mental status likely metabolic encephalopathy secondary to UTI, volume depletion.  Ammonia was 25 and within normal range.  TSH of 1.2, venous blood gas with pCO2 of 39.  Encephalopathy has resolved at this time.  Mild rhabdomyolysis secondary to fall. Improved.  Encouraged oral hydration on discharge.  Mild hyponatremia.  Serum osmolality within normal range.  Sodium level has improved to 138.   Tobacco abuse.  Counseling done.   Mild hypokalemia on presentation.  Has improved with replacement.   Latest potassium of 3.5.  Disposition.  At this time, patient is stable for disposition home with outpatient PCP follow-up.  Patient's wife at bedside.  Medical Consultants:   None.  Procedures:    None Subjective:   Today, patient was seen and examined at bedside.  Ambulating in the hallway.  Denies any urinary urgency frequency or dysuria.  Denies any fever chills or rigor.  Wants to go home.  Patient's wife at bedside  Discharge Exam:   Vitals:   10/22/23 0449 10/22/23 0950  BP: (!) 144/101 (!) 140/89  Pulse: 68 (P) 70  Resp: 16 (P) 18  Temp: 97.9 F (36.6 C)   SpO2: 94%    Vitals:   10/21/23 1209 10/21/23 1959 10/22/23 0449 10/22/23 0950  BP: (!) 154/99 (!) 154/104 (!) 144/101 (!) 140/89  Pulse: 83 66 68 (P) 70  Resp: 17 18 16  (P) 18  Temp: 98.1 F (36.7 C) 97.7 F (36.5 C) 97.9 F (36.6 C)   TempSrc: Oral Oral Oral   SpO2: 99% 98% 94%   Weight:      Height:       Body mass index is 24.33 kg/m.  General: Alert awake, not in obvious distress, elderly male HENT: pupils equally reacting to light,  No scleral pallor or icterus noted. Oral mucosa is moist.  Chest:  Clear breath sounds.  Diminished breath sounds bilaterally. No crackles or wheezes.  CVS: S1 &S2 heard. No murmur.  Regular rate and rhythm. Abdomen: Soft, nontender, nondistended.  Bowel sounds are heard.  No costovertebral angle tenderness. Extremities: No cyanosis, clubbing or edema.  Peripheral pulses are palpable. Psych: Alert, awake and oriented, normal mood CNS:  No cranial nerve deficits.  Power equal in all extremities.   Skin: Warm and dry.  No rashes noted.  The results of significant diagnostics from this hospitalization (including imaging, microbiology, ancillary and laboratory) are listed below for reference.     Diagnostic Studies:   ECHOCARDIOGRAM COMPLETE  Result Date: 10/20/2023    ECHOCARDIOGRAM REPORT   Patient Name:   Douglas Chavez Date of Exam: 10/20/2023 Medical Rec #:   [id number]       Height:       68.0 in Accession #:    [id number]      Weight:       160.0 lb Date of Birth:  10-18-1946       BSA:          1.859 m Patient Age:    77 years        BP:           137/83 mmHg Patient Gender: M               HR:           68 bpm. Exam Location:  Inpatient Procedure: 2D Echo, Cardiac Doppler and Color Doppler Indications:    R01.1 Murmur  History:        Patient has no prior history of Echocardiogram examinations.                 Signs/Symptoms:Bacteremia; Risk Factors:Hypertension and Current  Smoker.  Sonographer:    Sheralyn Boatman RDCS Referring Phys: [id number] ANASTASSIA DOUTOVA  Sonographer Comments: Technically difficult study due to poor echo windows. IMPRESSIONS  1. Left ventricular ejection fraction, by estimation, is 55 to 60%. The left ventricle has normal function. Left ventricular endocardial border not optimally defined to evaluate regional wall motion. Left ventricular diastolic parameters are indeterminate.  2. Right ventricular systolic function is normal. The right ventricular size is normal.  3. The mitral valve is degenerative. Trivial mitral valve regurgitation. No evidence of mitral stenosis.  4. The aortic valve was not well visualized. Aortic valve regurgitation is not visualized. No aortic stenosis is present.  5. The inferior vena cava is normal in size with greater than 50% respiratory variability, suggesting right atrial pressure of 3 mmHg. Comparison(s): No prior Echocardiogram. FINDINGS  Left Ventricle: Left ventricular ejection fraction, by estimation, is 55 to 60%. The left ventricle has normal function. Left ventricular endocardial border not optimally defined to evaluate regional wall motion. The left ventricular internal cavity size was normal in size. There is no left ventricular hypertrophy. Left ventricular diastolic parameters are indeterminate. Right Ventricle: The right ventricular size is normal. No increase in right ventricular wall  thickness. Right ventricular systolic function is normal. Left Atrium: Left atrial size was normal in size. Right Atrium: Right atrial size was normal in size. Pericardium: There is no evidence of pericardial effusion. Mitral Valve: The mitral valve is degenerative in appearance. Trivial mitral valve regurgitation. No evidence of mitral valve stenosis. Tricuspid Valve: The tricuspid valve is not well visualized. Tricuspid valve regurgitation is trivial. No evidence of tricuspid stenosis. Aortic Valve: The aortic valve was not well visualized. Aortic valve regurgitation is not visualized. No aortic stenosis is present. Pulmonic Valve: The pulmonic valve was not well visualized. Pulmonic valve regurgitation is not visualized. No evidence of pulmonic stenosis. Aorta: The aortic root and ascending aorta are structurally normal, with no evidence of dilitation. Venous: The inferior vena cava is normal in size with greater than 50% respiratory variability, suggesting right atrial pressure of 3 mmHg. IAS/Shunts: The interatrial septum was not well visualized.  LEFT VENTRICLE PLAX 2D LVIDd:         4.80 cm     Diastology LVIDs:         3.30 cm     LV e' medial:    4.68 cm/s LV PW:         1.05 cm     LV E/e' medial:  17.8 LV IVS:        1.10 cm     LV e' lateral:   8.27 cm/s LVOT diam:     2.20 cm     LV E/e' lateral: 10.1 LV SV:         90 LV SV Index:   49 LVOT Area:     3.80 cm  LV Volumes (MOD) LV vol d, MOD A2C: 81.0 ml LV vol d, MOD A4C: 50.2 ml LV vol s, MOD A2C: 36.6 ml LV vol s, MOD A4C: 18.9 ml LV SV MOD A2C:     44.4 ml LV SV MOD A4C:     50.2 ml LV SV MOD BP:      36.3 ml RIGHT VENTRICLE             IVC RV S prime:     10.40 cm/s  IVC diam: 1.40 cm TAPSE (M-mode): 1.8 cm LEFT ATRIUM  Index        RIGHT ATRIUM           Index LA diam:        3.50 cm 1.88 cm/m   RA Area:     13.90 cm LA Vol (A2C):   27.6 ml 14.85 ml/m  RA Volume:   33.00 ml  17.75 ml/m LA Vol (A4C):   38.7 ml 20.82 ml/m LA Biplane  Vol: 32.6 ml 17.54 ml/m  AORTIC VALVE LVOT Vmax:   117.00 cm/s LVOT Vmean:  76.400 cm/s LVOT VTI:    0.238 m  AORTA Ao Root diam: 3.65 cm Ao Asc diam:  3.60 cm MITRAL VALVE MV Area (PHT): 3.56 cm    SHUNTS MV Decel Time: 213 msec    Systemic VTI:  0.24 m MV E velocity: 83.20 cm/s  Systemic Diam: 2.20 cm MV A velocity: 93.40 cm/s MV E/A ratio:  0.89 Vishnu Priya Mallipeddi Electronically signed by Winfield Rast Mallipeddi Signature Date/Time: 10/20/2023/12:14:16 PM    Final    CT ABDOMEN PELVIS W CONTRAST  Result Date: 10/19/2023 CLINICAL DATA:  Acute nonlocalized abdominal pain EXAM: CT ABDOMEN AND PELVIS WITH CONTRAST TECHNIQUE: Multidetector CT imaging of the abdomen and pelvis was performed using the standard protocol following bolus administration of intravenous contrast. RADIATION DOSE REDUCTION: This exam was performed according to the departmental dose-optimization program which includes automated exposure control, adjustment of the mA and/or kV according to patient size and/or use of iterative reconstruction technique. CONTRAST:  85mL OMNIPAQUE IOHEXOL 300 MG/ML  SOLN COMPARISON:  None Available. FINDINGS: Lower chest: No acute abnormality. Hepatobiliary: Hepatic steatosis. Large gallstones in the gallbladder without evidence of acute cholecystitis. No biliary dilation. Pancreas: Unremarkable. Spleen: Unremarkable. Adrenals/Urinary Tract: Normal adrenal glands. No urinary calculi or hydronephrosis. Right posterior bladder diverticulum. Stomach/Bowel: Normal caliber large and small bowel. No bowel wall thickening. The appendix is normal.Stomach is within normal limits. Vascular/Lymphatic: No significant vascular findings are present. No enlarged abdominal or pelvic lymph nodes. Reproductive: Enlarged prostate Other: No free intraperitoneal fluid or air. Musculoskeletal: No acute fracture. IMPRESSION: 1. No acute abnormality in the abdomen or pelvis. 2. Hepatic steatosis. 3. Cholelithiasis without  evidence of acute cholecystitis. 4. Enlarged prostate. Electronically Signed   By: Minerva Fester M.D.   On: 10/19/2023 20:11   DG Chest 2 View  Result Date: 10/19/2023 CLINICAL DATA:  Found on floor in bathroom today.  Nonresponsive. EXAM: CHEST - 2 VIEW COMPARISON:  05/11/2017 FINDINGS: The heart size is normal. Stable ectasia of thoracic aorta. Both lungs are clear. The visualized skeletal structures are unremarkable. IMPRESSION: No active cardiopulmonary disease. Electronically Signed   By: Danae Orleans M.D.   On: 10/19/2023 16:50   CT Head Wo Contrast  Result Date: 10/19/2023 CLINICAL DATA:  Mental status change, unknown cause; Neck trauma (Age >= 65y) EXAM: CT HEAD WITHOUT CONTRAST CT CERVICAL SPINE WITHOUT CONTRAST TECHNIQUE: Multidetector CT imaging of the head and cervical spine was performed following the standard protocol without intravenous contrast. Multiplanar CT image reconstructions of the cervical spine were also generated. RADIATION DOSE REDUCTION: This exam was performed according to the departmental dose-optimization program which includes automated exposure control, adjustment of the mA and/or kV according to patient size and/or use of iterative reconstruction technique. COMPARISON:  None Available. FINDINGS: CT HEAD FINDINGS Brain: No evidence of acute infarction, hemorrhage, hydrocephalus, extra-axial collection or mass lesion/mass effect. Vascular: No hyperdense vessel or unexpected calcification. Skull: No acute fracture. Sinuses/Orbits: Right maxillary sinus mucosal thickening. No  acute orbital findings. Other: Right parotid mass is seen on the CT of the cervical spine and described below. CT CERVICAL SPINE FINDINGS Alignment: No substantial sagittal subluxation. Skull base and vertebrae: No acute fracture. Vertebral body heights are maintained. Soft tissues and spinal canal: No prevertebral fluid or swelling. No visible canal hematoma. Disc levels:  Moderate multilevel  degenerative change. Upper chest: Visualized lung apices are clear. Other: Approximately 2.1 cm right parotid mass. Superficial 2.7 cm low-attenuation lesion in the right lower neck. IMPRESSION: 1. No evidence of acute traumatic abnormality intracranially or in the cervical spine 2. 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 Recommend ENT consultation for management. 3. Superficial 2.7 cm low-attenuation lesion in the right lower neck, most likely of dermal origin (such as a sebaceous cyst or similar). Electronically Signed   By: Feliberto Harts M.D.   On: 10/19/2023 16:44   CT Cervical Spine Wo Contrast  Result Date: 10/19/2023 CLINICAL DATA:  Mental status change, unknown cause; Neck trauma (Age >= 65y) EXAM: CT HEAD WITHOUT CONTRAST CT CERVICAL SPINE WITHOUT CONTRAST TECHNIQUE: Multidetector CT imaging of the head and cervical spine was performed following the standard protocol without intravenous contrast. Multiplanar CT image reconstructions of the cervical spine were also generated. RADIATION DOSE REDUCTION: This exam was performed according to the departmental dose-optimization program which includes automated exposure control, adjustment of the mA and/or kV according to patient size and/or use of iterative reconstruction technique. COMPARISON:  None Available. FINDINGS: CT HEAD FINDINGS Brain: No evidence of acute infarction, hemorrhage, hydrocephalus, extra-axial collection or mass lesion/mass effect. Vascular: No hyperdense vessel or unexpected calcification. Skull: No acute fracture. Sinuses/Orbits: Right maxillary sinus mucosal thickening. No acute orbital findings. Other: Right parotid mass is seen on the CT of the cervical spine and described below. CT CERVICAL SPINE FINDINGS Alignment: No substantial sagittal subluxation. Skull base and vertebrae: No acute fracture. Vertebral body heights are maintained. Soft tissues and spinal canal: No prevertebral  fluid or swelling. No visible canal hematoma. Disc levels:  Moderate multilevel degenerative change. Upper chest: Visualized lung apices are clear. Other: Approximately 2.1 cm right parotid mass. Superficial 2.7 cm low-attenuation lesion in the right lower neck. IMPRESSION: 1. No evidence of acute traumatic abnormality intracranially or in the cervical spine 2. 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 Recommend ENT consultation for management. 3. Superficial 2.7 cm low-attenuation lesion in the right lower neck, most likely of dermal origin (such as a sebaceous cyst or similar). Electronically Signed   By: Feliberto Harts M.D.   On: 10/19/2023 16:44     Labs:   Basic Metabolic Panel: Recent Labs  Lab 10/19/23 1606 10/20/23 0001 10/20/23 0427 10/21/23 0418  NA 137 137 133* 138  K 3.4* 4.0 3.9 3.5  CL 105 109 106 110  CO2 21* 21* 22 21*  GLUCOSE 103* 129* 108* 110*  BUN 24* 24* 30* 20  CREATININE 0.84 0.89 0.88 0.67  CALCIUM 8.7* 8.4* 8.0* 7.9*  MG 1.8  --  2.2 2.0  PHOS  --  3.2 2.7  --    GFR Estimated Creatinine Clearance: 76 mL/min (by C-G formula based on SCr of 0.67 mg/dL). Liver Function Tests: Recent Labs  Lab 10/19/23 1606 10/20/23 0427  AST 52* 100*  ALT 15 26  ALKPHOS 62 51  BILITOT 1.5* 0.9  PROT 6.3* 5.6*  ALBUMIN 4.0 3.0*   No results for input(s): "LIPASE", "AMYLASE" in the last 168  hours. Recent Labs  Lab 10/20/23 0001  AMMONIA 25   Coagulation profile Recent Labs  Lab 10/19/23 1646  INR 1.1    CBC: Recent Labs  Lab 10/19/23 1606 10/20/23 0427 10/21/23 0418  WBC 13.3* 11.3* 6.4  NEUTROABS 12.3*  --   --   HGB 14.5 12.6* 12.6*  HCT 41.8 38.4* 37.8*  MCV 92.1 98.2 96.2  PLT 114* 99* 98*   Cardiac Enzymes: Recent Labs  Lab 10/19/23 1606 10/20/23 0001 10/20/23 0427 10/21/23 0418  CKTOTAL 3,413* 5,827* 4,713* 2,312*   BNP: Invalid input(s): "POCBNP" CBG: Recent Labs  Lab 10/19/23 1645   GLUCAP 125*   D-Dimer No results for input(s): "DDIMER" in the last 72 hours. Hgb A1c No results for input(s): "HGBA1C" in the last 72 hours. Lipid Profile No results for input(s): "CHOL", "HDL", "LDLCALC", "TRIG", "CHOLHDL", "LDLDIRECT" in the last 72 hours. Thyroid function studies Recent Labs    10/20/23 0001  TSH 1.213   Anemia work up No results for input(s): "VITAMINB12", "FOLATE", "FERRITIN", "TIBC", "IRON", "RETICCTPCT" in the last 72 hours. Microbiology Recent Results (from the past 240 hour(s))  Blood Culture (routine x 2)     Status: None (Preliminary result)   Collection Time: 10/19/23  4:32 PM   Specimen: BLOOD  Result Value Ref Range Status   Specimen Description   Final    BLOOD RIGHT ANTECUBITAL Performed at [facility], [address], [place], [place] [address]    Special Requests   Final    Blood Culture results may not be optimal due to an inadequate volume of blood received in culture bottles BOTTLES DRAWN AEROBIC AND ANAEROBIC Performed at [facility], [address] [place], [place] [address]    Culture   Final    NO GROWTH 3 DAYS Performed at [facility], [address], [place], [place] [address]    Report Status PENDING  Incomplete  Blood Culture (routine x 2)     Status: None (Preliminary result)   Collection Time: 10/19/23  4:37 PM   Specimen: BLOOD  Result Value Ref Range Status   Specimen Description   Final    BLOOD BLOOD LEFT WRIST Performed at [facility], [address], [place], [place] [address]    Special Requests   Final    Blood Culture adequate volume BOTTLES DRAWN AEROBIC AND ANAEROBIC Performed at [facility], [address] [place], [place] [address]    Culture   Final    NO GROWTH 3 DAYS Performed at [facility], [address], [place], [place] [address]    Report Status PENDING  Incomplete  Resp panel by RT-PCR (RSV,  Flu A&B, Covid) Anterior Nasal Swab     Status: None   Collection Time: 10/19/23  4:46 PM   Specimen: Anterior Nasal Swab  Result Value Ref Range Status   SARS Coronavirus 2 by RT PCR NEGATIVE NEGATIVE Final    Comment: (NOTE) SARS-CoV-2 target nucleic acids are NOT DETECTED.  The SARS-CoV-2 RNA is generally detectable in upper respiratory specimens during the acute phase of infection. The lowest concentration of SARS-CoV-2 viral copies this assay can detect is 138 copies/mL. A negative result does not preclude SARS-Cov-2 infection and should not be used as the sole basis for treatment or other patient management decisions. A negative result may occur with  improper specimen collection/handling, submission of specimen other than nasopharyngeal swab, presence of viral mutation(s) within the areas targeted by this  assay, and inadequate number of viral copies(<138 copies/mL). A negative result must be combined with clinical observations, patient history, and epidemiological information. The expected result is Negative.  Fact Sheet for Patients:  BloggerCourse.com  Fact Sheet for Healthcare Providers:  SeriousBroker.it  This test is no t yet approved or cleared by the [place] FDA and  has been authorized for detection and/or diagnosis of SARS-CoV-2 by FDA under an Emergency Use Authorization (EUA). This EUA will remain  in effect (meaning this test can be used) for the duration of the COVID-19 declaration under Section 564(b)(1) of the Act, 21 U.S.C.section 360bbb-3(b)(1), unless the authorization is terminated  or revoked sooner.       Influenza A by PCR NEGATIVE NEGATIVE Final   Influenza B by PCR NEGATIVE NEGATIVE Final    Comment: (NOTE) The Xpert Xpress SARS-CoV-2/FLU/RSV plus assay is intended as an aid in the diagnosis of influenza from Nasopharyngeal swab specimens and should not be used as a sole basis for  treatment. Nasal washings and aspirates are unacceptable for Xpert Xpress SARS-CoV-2/FLU/RSV testing.  Fact Sheet for Patients: BloggerCourse.com  Fact Sheet for Healthcare Providers: SeriousBroker.it  This test is not yet approved or cleared by the [place] FDA and has been authorized for detection and/or diagnosis of SARS-CoV-2 by FDA under an Emergency Use Authorization (EUA). This EUA will remain in effect (meaning this test can be used) for the duration of the COVID-19 declaration under Section 564(b)(1) of the Act, 21 U.S.C. section 360bbb-3(b)(1), unless the authorization is terminated or revoked.     Resp Syncytial Virus by PCR NEGATIVE NEGATIVE Final    Comment: (NOTE) Fact Sheet for Patients: BloggerCourse.com  Fact Sheet for Healthcare Providers: SeriousBroker.it  This test is not yet approved or cleared by the [place] FDA and has been authorized for detection and/or diagnosis of SARS-CoV-2 by FDA under an Emergency Use Authorization (EUA). This EUA will remain in effect (meaning this test can be used) for the duration of the COVID-19 declaration under Section 564(b)(1) of the Act, 21 U.S.C. section 360bbb-3(b)(1), unless the authorization is terminated or revoked.  Performed at [facility], [address] [place], [place] [address]   Urine Culture     Status: Abnormal   Collection Time: 10/19/23  4:46 PM   Specimen: Urine, Clean Catch  Result Value Ref Range Status   Specimen Description   Final    URINE, CLEAN CATCH Performed at [facility], [address] [place], [place] [address]    Special Requests   Final    NONE Performed at [facility], [address] [place], [place] [address]    Culture >=100,000 COLONIES/mL [facility] MORGANII (A)  Final   Report Status 10/22/2023 FINAL   Final   Organism ID, Bacteria MORGANELLA MORGANII (A)  Final      Susceptibility   Morganella morganii - MIC*    AMPICILLIN >=32 RESISTANT Resistant     CIPROFLOXACIN <=0.25 SENSITIVE Sensitive     GENTAMICIN <=1 SENSITIVE Sensitive     IMIPENEM 1 SENSITIVE Sensitive     NITROFURANTOIN 128 RESISTANT Resistant     TRIMETH/SULFA <=20 SENSITIVE Sensitive     AMPICILLIN/SULBACTAM 8 SENSITIVE Sensitive     PIP/TAZO <=4 SENSITIVE Sensitive ug/mL    * >=100,000 COLONIES/mL MORGANELLA MORGANII     Discharge Instructions:   Discharge Instructions     Call MD for:  persistant nausea and vomiting   Complete by: As directed    Call  MD for:  severe uncontrolled pain   Complete by: As directed    Call MD for:  temperature >100.4   Complete by: As directed    Diet - low sodium heart healthy   Complete by: As directed    Discharge instructions   Complete by: As directed    Follow-up with your primary care physician in 1 week.  Check blood work at that time.  Complete the course of antibiotic.  Seek medical attention for worsening symptoms.  Follow-up with ENT as outpatient for  right parotid gland swelling.  Consider quitting smoking..   Increase activity slowly   Complete by: As directed       Allergies as of 10/22/2023   No Known Allergies      Medication List     STOP taking these medications    cyclobenzaprine 10 MG tablet Commonly known as: FLEXERIL   HYDROcodone-acetaminophen 5-325 MG tablet Commonly known as: NORCO/VICODIN       TAKE these medications    betamethasone dipropionate 0.05 % cream Apply 1 application  topically 2 (two) times daily as needed (for itching/irritated areas of bilateral lower legs).   ciprofloxacin 500 MG tablet Commonly known as: Cipro Take 1 tablet (500 mg total) by mouth 2 (two) times daily for 7 days.   finasteride 5 MG tablet Commonly known as: PROSCAR Take 5 mg by mouth daily.   lisinopril 10 MG tablet Commonly known as:  ZESTRIL Take 10 mg by mouth daily.   tamsulosin 0.4 MG Caps capsule Commonly known as: FLOMAX Take 0.8 mg by mouth daily after breakfast.        Follow-up Information     [facility], [facility] Follow up in 1 week(s).   Contact information: [address] [facility] [place] [address] [phone]                  Time coordinating discharge: 39 minutes  Signed:  Neely Kammerer  [facility] 10/22/2023, 2:31 PM

## 2023-10-24 LAB — CULTURE, BLOOD (ROUTINE X 2)
Culture: NO GROWTH
Culture: NO GROWTH
Special Requests: ADEQUATE

## 2024-03-08 DIAGNOSIS — N3289 Other specified disorders of bladder: Secondary | ICD-10-CM | POA: Diagnosis not present

## 2024-03-08 DIAGNOSIS — R410 Disorientation, unspecified: Secondary | ICD-10-CM | POA: Diagnosis not present

## 2024-03-08 DIAGNOSIS — K8020 Calculus of gallbladder without cholecystitis without obstruction: Secondary | ICD-10-CM | POA: Diagnosis not present

## 2024-03-08 DIAGNOSIS — G9340 Encephalopathy, unspecified: Secondary | ICD-10-CM | POA: Diagnosis not present

## 2024-03-08 DIAGNOSIS — R4182 Altered mental status, unspecified: Secondary | ICD-10-CM | POA: Diagnosis not present

## 2024-03-08 DIAGNOSIS — N401 Enlarged prostate with lower urinary tract symptoms: Secondary | ICD-10-CM | POA: Diagnosis not present

## 2024-03-08 DIAGNOSIS — N3001 Acute cystitis with hematuria: Secondary | ICD-10-CM | POA: Diagnosis not present

## 2024-03-08 DIAGNOSIS — Z1152 Encounter for screening for COVID-19: Secondary | ICD-10-CM | POA: Diagnosis not present

## 2024-03-08 DIAGNOSIS — A419 Sepsis, unspecified organism: Secondary | ICD-10-CM | POA: Diagnosis not present

## 2024-03-08 DIAGNOSIS — I10 Essential (primary) hypertension: Secondary | ICD-10-CM | POA: Diagnosis not present

## 2024-03-08 DIAGNOSIS — R338 Other retention of urine: Secondary | ICD-10-CM | POA: Diagnosis not present

## 2024-03-09 DIAGNOSIS — N3001 Acute cystitis with hematuria: Secondary | ICD-10-CM | POA: Diagnosis not present

## 2024-03-10 DIAGNOSIS — N3001 Acute cystitis with hematuria: Secondary | ICD-10-CM | POA: Diagnosis not present
